# Patient Record
Sex: Male | Born: 1950
Health system: Southern US, Community
[De-identification: ages and names within clinical notes are randomized; demographics above are authoritative.]

## PROBLEM LIST (undated history)

## (undated) DIAGNOSIS — R7303 Prediabetes: Secondary | ICD-10-CM

## (undated) DIAGNOSIS — E079 Disorder of thyroid, unspecified: Secondary | ICD-10-CM

## (undated) DIAGNOSIS — I10 Essential (primary) hypertension: Secondary | ICD-10-CM

## (undated) HISTORY — PX: SHOULDER SURGERY: SHX246

## (undated) HISTORY — PX: MOUTH SURGERY: SHX715

---

## 2000-09-24 ENCOUNTER — Encounter: Payer: Self-pay | Admitting: Family Medicine

## 2000-09-24 ENCOUNTER — Ambulatory Visit (HOSPITAL_COMMUNITY): Admission: RE | Admit: 2000-09-24 | Discharge: 2000-09-24 | Payer: Self-pay | Admitting: Family Medicine

## 2001-09-23 ENCOUNTER — Encounter: Payer: Self-pay | Admitting: Family Medicine

## 2001-09-23 ENCOUNTER — Ambulatory Visit (HOSPITAL_COMMUNITY): Admission: RE | Admit: 2001-09-23 | Discharge: 2001-09-23 | Payer: Self-pay | Admitting: Family Medicine

## 2001-09-25 ENCOUNTER — Encounter: Payer: Self-pay | Admitting: Family Medicine

## 2001-09-25 ENCOUNTER — Ambulatory Visit (HOSPITAL_COMMUNITY): Admission: RE | Admit: 2001-09-25 | Discharge: 2001-09-25 | Payer: Self-pay | Admitting: Family Medicine

## 2008-08-20 ENCOUNTER — Encounter: Admission: RE | Admit: 2008-08-20 | Discharge: 2008-08-20 | Payer: Self-pay | Admitting: Family Medicine

## 2010-06-24 IMAGING — US US EXTREM LOW VENOUS*R*
1 series · 14 of 24 positions shown · non-contrast
Comparison: None.

CLINICAL DATA: Right knee pain.  Evaluate for DVT versus a ruptured
Baker's cyst.



[Series 1: us extrem low venous*right* · 14 of 39 slices shown]
[im 1/39]
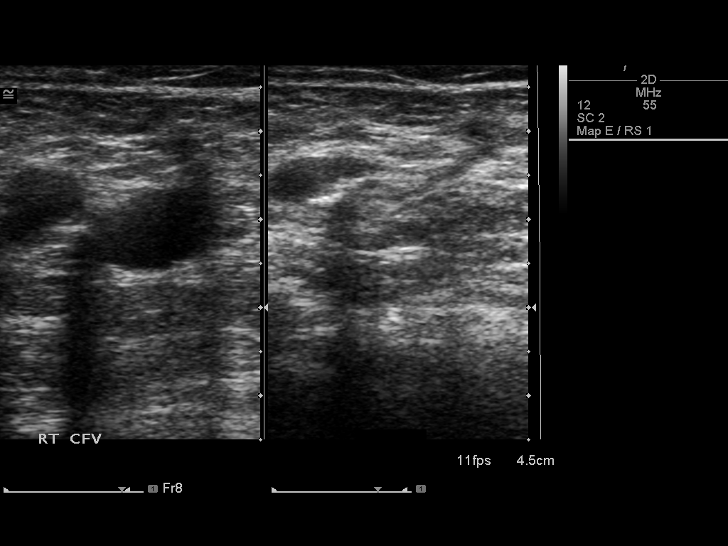
[im 4/39]
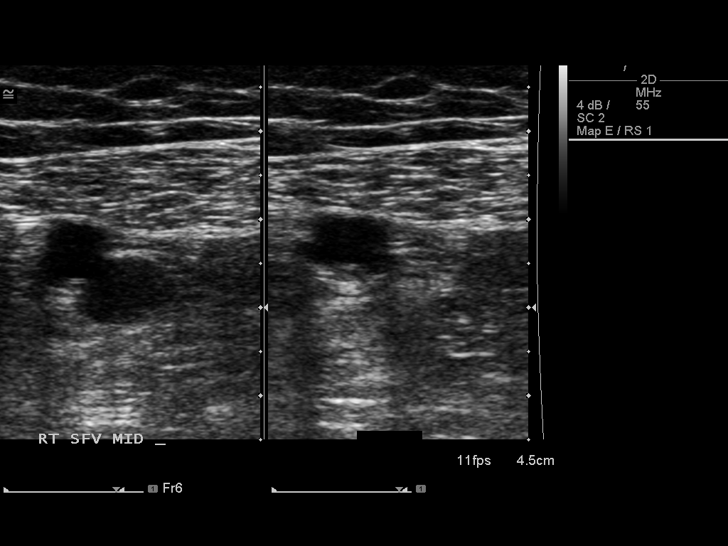
[im 7/39]
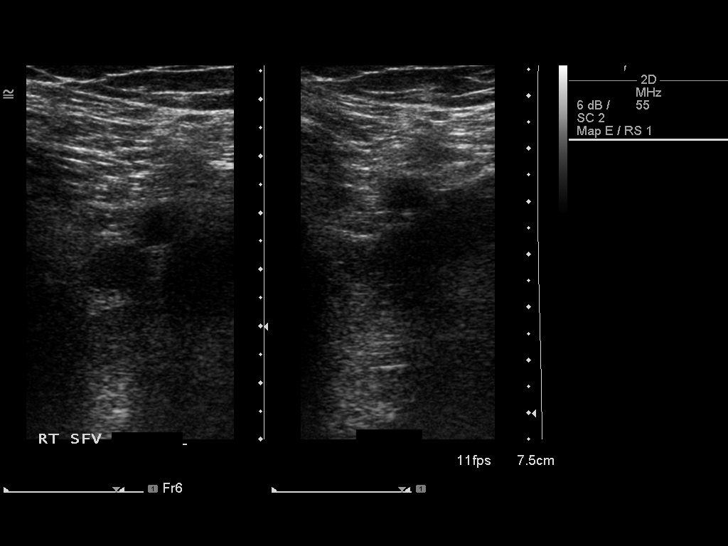
[im 10/39]
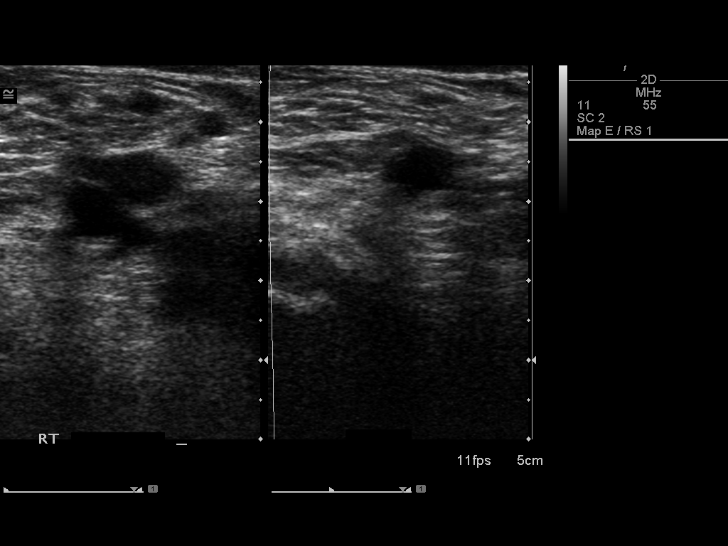
[im 12/39]
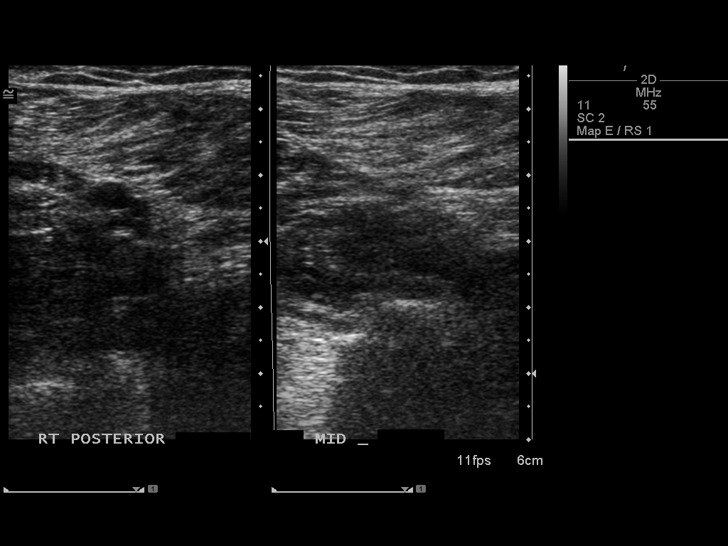
[im 15/39]
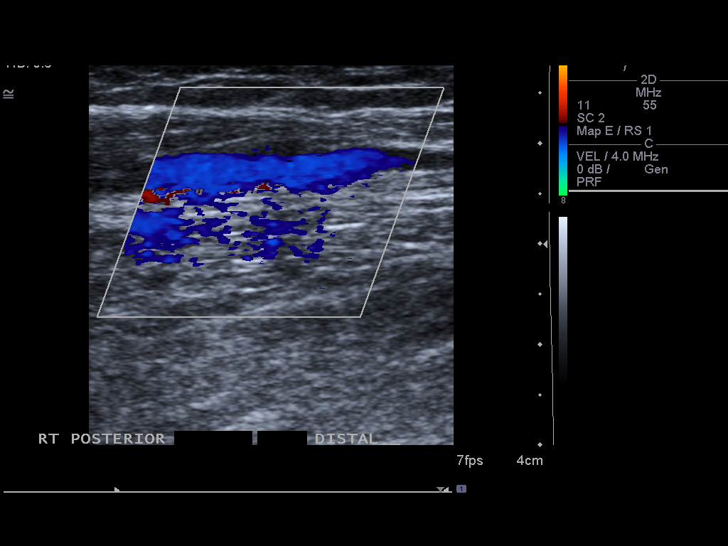
[im 19/39]
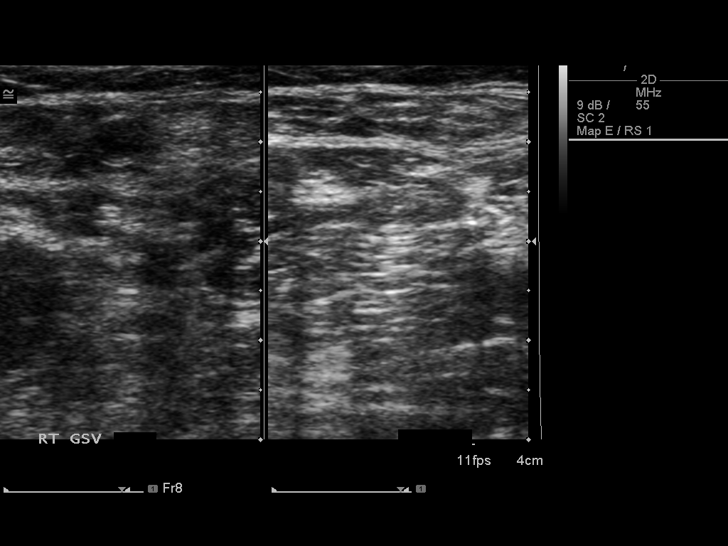
[im 20/39]
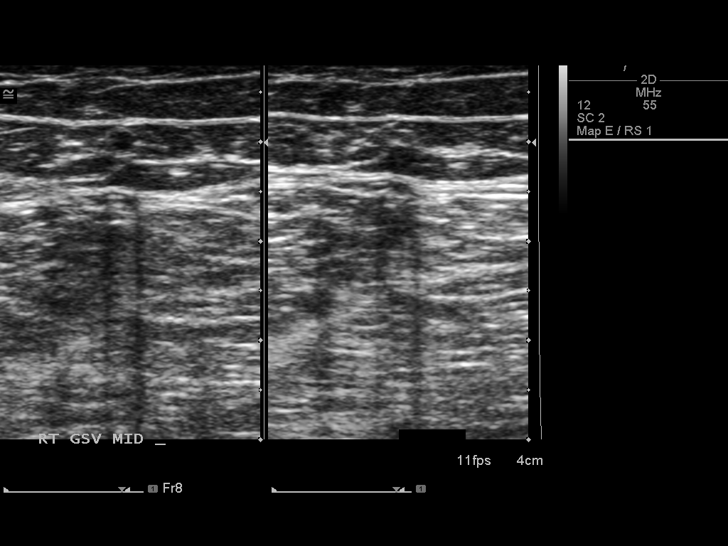
[im 24/39]
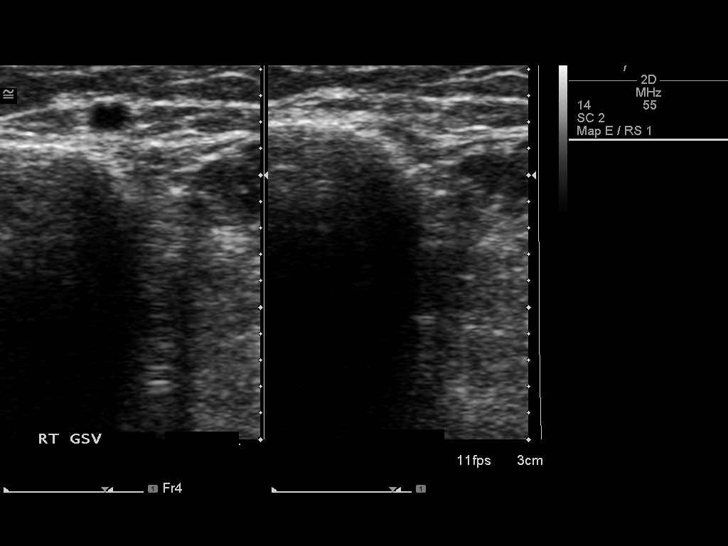
[im 27/39]
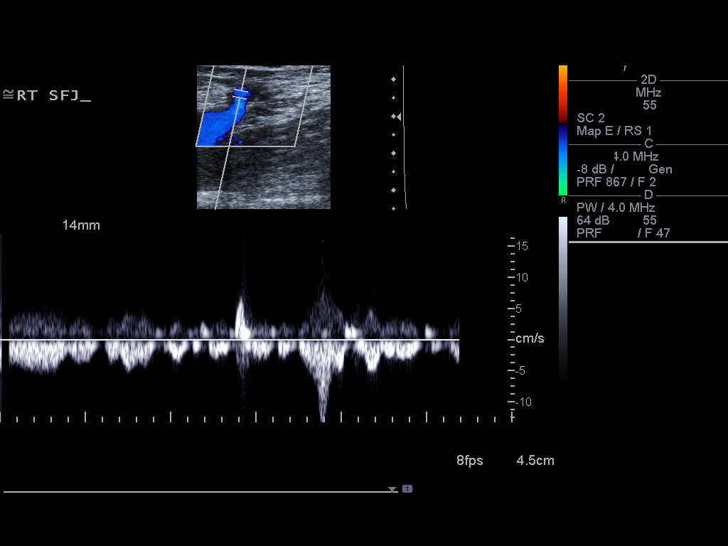
[im 30/39]
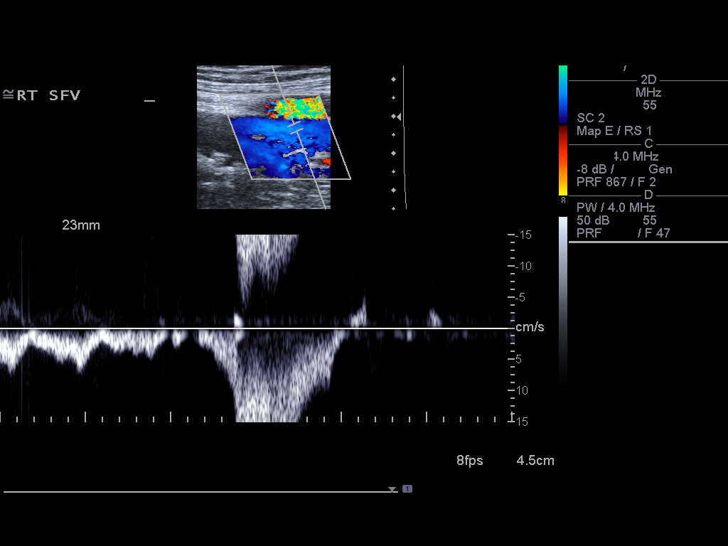
[im 32/39]
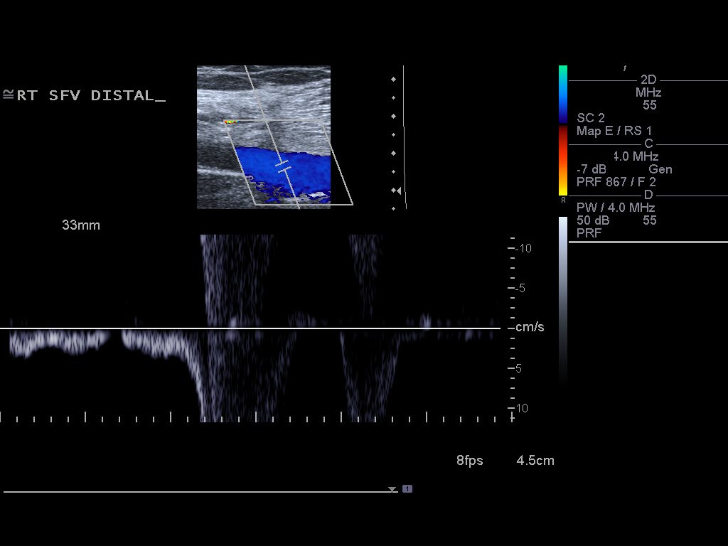
[im 35/39]
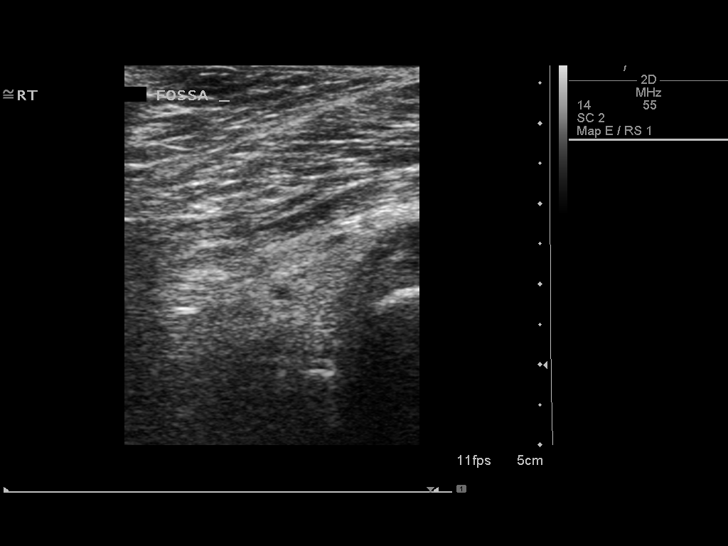
[im 39/39]
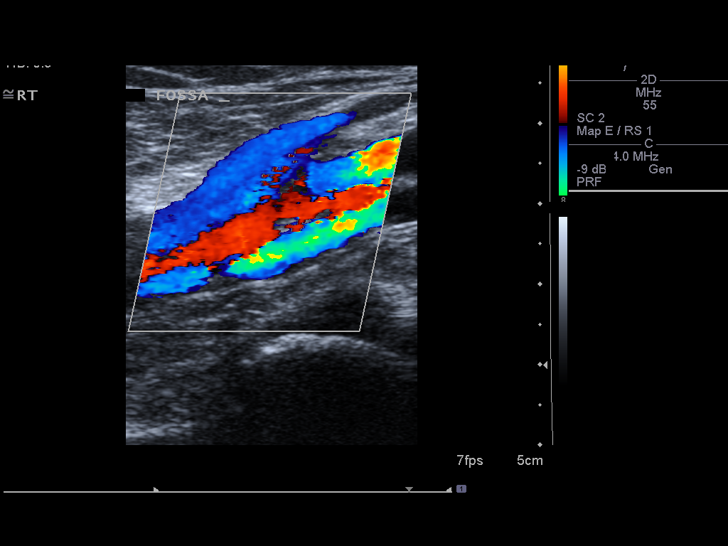

[14 of 24 positions shown; findings below may reference images not displayed]

FINDINGS: From the level of the right common femoral vein to the
right popliteal vein, there is adequate color flow, augmentation
and compression.  There is adequate color flow and compression of
the proximal right calf veins.  Greater saphenous vein compresses
appropriately.  No Baker's cyst or evidence of ruptured Baker's
cyst detected.
IMPRESSION: No evidence of right lower extremity deep venous thrombosis.

No findings of Baker's cyst or ruptured Baker's cyst detected by
ultrasound.

## 2010-09-13 ENCOUNTER — Other Ambulatory Visit: Payer: Self-pay | Admitting: Family Medicine

## 2010-09-13 ENCOUNTER — Ambulatory Visit
Admission: RE | Admit: 2010-09-13 | Discharge: 2010-09-13 | Disposition: A | Discharge status: Home / Self Care | Payer: 59 | Source: Ambulatory Visit | Attending: Family Medicine | Admitting: Family Medicine

## 2010-09-13 DIAGNOSIS — M542 Cervicalgia: Secondary | ICD-10-CM

## 2010-09-13 DIAGNOSIS — M549 Dorsalgia, unspecified: Secondary | ICD-10-CM

## 2012-08-26 ENCOUNTER — Other Ambulatory Visit: Payer: Self-pay | Admitting: Family Medicine

## 2012-08-26 DIAGNOSIS — R1011 Right upper quadrant pain: Secondary | ICD-10-CM

## 2012-08-27 ENCOUNTER — Ambulatory Visit
Admission: RE | Admit: 2012-08-27 | Discharge: 2012-08-27 | Disposition: A | Discharge status: Home / Self Care | Payer: 59 | Source: Ambulatory Visit | Attending: Family Medicine | Admitting: Family Medicine

## 2012-08-27 DIAGNOSIS — R1011 Right upper quadrant pain: Secondary | ICD-10-CM

## 2015-07-06 DIAGNOSIS — Z23 Encounter for immunization: Secondary | ICD-10-CM | POA: Diagnosis not present

## 2015-07-06 DIAGNOSIS — I10 Essential (primary) hypertension: Secondary | ICD-10-CM | POA: Diagnosis not present

## 2015-07-06 DIAGNOSIS — D485 Neoplasm of uncertain behavior of skin: Secondary | ICD-10-CM | POA: Diagnosis not present

## 2015-07-06 DIAGNOSIS — N529 Male erectile dysfunction, unspecified: Secondary | ICD-10-CM | POA: Diagnosis not present

## 2015-07-06 DIAGNOSIS — E7800 Pure hypercholesterolemia, unspecified: Secondary | ICD-10-CM | POA: Diagnosis not present

## 2015-07-06 DIAGNOSIS — Z1211 Encounter for screening for malignant neoplasm of colon: Secondary | ICD-10-CM | POA: Diagnosis not present

## 2015-07-06 DIAGNOSIS — E039 Hypothyroidism, unspecified: Secondary | ICD-10-CM | POA: Diagnosis not present

## 2015-07-06 DIAGNOSIS — Z125 Encounter for screening for malignant neoplasm of prostate: Secondary | ICD-10-CM | POA: Diagnosis not present

## 2015-07-06 DIAGNOSIS — H8110 Benign paroxysmal vertigo, unspecified ear: Secondary | ICD-10-CM | POA: Diagnosis not present

## 2015-07-06 DIAGNOSIS — R7309 Other abnormal glucose: Secondary | ICD-10-CM | POA: Diagnosis not present

## 2015-07-06 DIAGNOSIS — Z0000 Encounter for general adult medical examination without abnormal findings: Secondary | ICD-10-CM | POA: Diagnosis not present

## 2015-07-07 ENCOUNTER — Other Ambulatory Visit: Payer: Self-pay | Admitting: Family Medicine

## 2015-07-07 DIAGNOSIS — Z136 Encounter for screening for cardiovascular disorders: Secondary | ICD-10-CM

## 2015-07-16 ENCOUNTER — Ambulatory Visit
Admission: RE | Admit: 2015-07-16 | Discharge: 2015-07-16 | Disposition: A | Discharge status: Home / Self Care | Payer: Medicare Other | Source: Ambulatory Visit | Attending: Family Medicine | Admitting: Family Medicine

## 2015-07-16 DIAGNOSIS — Z136 Encounter for screening for cardiovascular disorders: Secondary | ICD-10-CM | POA: Diagnosis not present

## 2015-08-23 DIAGNOSIS — D485 Neoplasm of uncertain behavior of skin: Secondary | ICD-10-CM | POA: Diagnosis not present

## 2015-08-23 DIAGNOSIS — L570 Actinic keratosis: Secondary | ICD-10-CM | POA: Diagnosis not present

## 2015-10-28 DIAGNOSIS — Z23 Encounter for immunization: Secondary | ICD-10-CM | POA: Diagnosis not present

## 2015-11-09 ENCOUNTER — Other Ambulatory Visit: Payer: Self-pay | Admitting: Family Medicine

## 2015-11-09 ENCOUNTER — Ambulatory Visit
Admission: RE | Admit: 2015-11-09 | Discharge: 2015-11-09 | Disposition: A | Discharge status: Home / Self Care | Payer: Medicare Other | Source: Ambulatory Visit | Attending: Family Medicine | Admitting: Family Medicine

## 2015-11-09 DIAGNOSIS — R059 Cough, unspecified: Secondary | ICD-10-CM

## 2015-11-09 DIAGNOSIS — R05 Cough: Secondary | ICD-10-CM | POA: Diagnosis not present

## 2015-12-15 DIAGNOSIS — R1031 Right lower quadrant pain: Secondary | ICD-10-CM | POA: Diagnosis not present

## 2015-12-24 DIAGNOSIS — K635 Polyp of colon: Secondary | ICD-10-CM | POA: Diagnosis not present

## 2015-12-24 DIAGNOSIS — Z1211 Encounter for screening for malignant neoplasm of colon: Secondary | ICD-10-CM | POA: Diagnosis not present

## 2015-12-24 DIAGNOSIS — K633 Ulcer of intestine: Secondary | ICD-10-CM | POA: Diagnosis not present

## 2015-12-24 DIAGNOSIS — K523 Indeterminate colitis: Secondary | ICD-10-CM | POA: Diagnosis not present

## 2015-12-24 DIAGNOSIS — K529 Noninfective gastroenteritis and colitis, unspecified: Secondary | ICD-10-CM | POA: Diagnosis not present

## 2015-12-24 DIAGNOSIS — D126 Benign neoplasm of colon, unspecified: Secondary | ICD-10-CM | POA: Diagnosis not present

## 2015-12-28 DIAGNOSIS — D126 Benign neoplasm of colon, unspecified: Secondary | ICD-10-CM | POA: Diagnosis not present

## 2015-12-28 DIAGNOSIS — K633 Ulcer of intestine: Secondary | ICD-10-CM | POA: Diagnosis not present

## 2015-12-28 DIAGNOSIS — Z1211 Encounter for screening for malignant neoplasm of colon: Secondary | ICD-10-CM | POA: Diagnosis not present

## 2015-12-28 DIAGNOSIS — K635 Polyp of colon: Secondary | ICD-10-CM | POA: Diagnosis not present

## 2016-01-10 DIAGNOSIS — H8110 Benign paroxysmal vertigo, unspecified ear: Secondary | ICD-10-CM | POA: Diagnosis not present

## 2016-01-10 DIAGNOSIS — I10 Essential (primary) hypertension: Secondary | ICD-10-CM | POA: Diagnosis not present

## 2016-01-10 DIAGNOSIS — E7800 Pure hypercholesterolemia, unspecified: Secondary | ICD-10-CM | POA: Diagnosis not present

## 2016-01-10 DIAGNOSIS — R7301 Impaired fasting glucose: Secondary | ICD-10-CM | POA: Diagnosis not present

## 2016-01-10 DIAGNOSIS — N529 Male erectile dysfunction, unspecified: Secondary | ICD-10-CM | POA: Diagnosis not present

## 2016-07-05 DIAGNOSIS — E119 Type 2 diabetes mellitus without complications: Secondary | ICD-10-CM | POA: Diagnosis not present

## 2016-07-05 DIAGNOSIS — H25813 Combined forms of age-related cataract, bilateral: Secondary | ICD-10-CM | POA: Diagnosis not present

## 2016-08-08 DIAGNOSIS — Z23 Encounter for immunization: Secondary | ICD-10-CM | POA: Diagnosis not present

## 2016-08-08 DIAGNOSIS — R7303 Prediabetes: Secondary | ICD-10-CM | POA: Diagnosis not present

## 2016-08-08 DIAGNOSIS — F17200 Nicotine dependence, unspecified, uncomplicated: Secondary | ICD-10-CM | POA: Diagnosis not present

## 2016-08-08 DIAGNOSIS — Z1389 Encounter for screening for other disorder: Secondary | ICD-10-CM | POA: Diagnosis not present

## 2016-08-08 DIAGNOSIS — N529 Male erectile dysfunction, unspecified: Secondary | ICD-10-CM | POA: Diagnosis not present

## 2016-08-08 DIAGNOSIS — H8110 Benign paroxysmal vertigo, unspecified ear: Secondary | ICD-10-CM | POA: Diagnosis not present

## 2016-08-08 DIAGNOSIS — E7800 Pure hypercholesterolemia, unspecified: Secondary | ICD-10-CM | POA: Diagnosis not present

## 2016-08-08 DIAGNOSIS — Z6827 Body mass index (BMI) 27.0-27.9, adult: Secondary | ICD-10-CM | POA: Diagnosis not present

## 2016-08-08 DIAGNOSIS — I77811 Abdominal aortic ectasia: Secondary | ICD-10-CM | POA: Diagnosis not present

## 2016-08-08 DIAGNOSIS — Z0000 Encounter for general adult medical examination without abnormal findings: Secondary | ICD-10-CM | POA: Diagnosis not present

## 2016-08-08 DIAGNOSIS — I10 Essential (primary) hypertension: Secondary | ICD-10-CM | POA: Diagnosis not present

## 2016-09-20 DIAGNOSIS — M62830 Muscle spasm of back: Secondary | ICD-10-CM | POA: Diagnosis not present

## 2016-10-02 DIAGNOSIS — I10 Essential (primary) hypertension: Secondary | ICD-10-CM | POA: Diagnosis not present

## 2016-10-02 DIAGNOSIS — R109 Unspecified abdominal pain: Secondary | ICD-10-CM | POA: Diagnosis not present

## 2016-10-20 DIAGNOSIS — Z23 Encounter for immunization: Secondary | ICD-10-CM | POA: Diagnosis not present

## 2017-08-03 DIAGNOSIS — R55 Syncope and collapse: Secondary | ICD-10-CM | POA: Diagnosis not present

## 2017-08-03 DIAGNOSIS — K529 Noninfective gastroenteritis and colitis, unspecified: Secondary | ICD-10-CM | POA: Diagnosis not present

## 2017-08-15 DIAGNOSIS — E039 Hypothyroidism, unspecified: Secondary | ICD-10-CM | POA: Diagnosis not present

## 2017-08-15 DIAGNOSIS — R7303 Prediabetes: Secondary | ICD-10-CM | POA: Diagnosis not present

## 2017-08-15 DIAGNOSIS — Z1389 Encounter for screening for other disorder: Secondary | ICD-10-CM | POA: Diagnosis not present

## 2017-08-15 DIAGNOSIS — H8110 Benign paroxysmal vertigo, unspecified ear: Secondary | ICD-10-CM | POA: Diagnosis not present

## 2017-08-15 DIAGNOSIS — E7800 Pure hypercholesterolemia, unspecified: Secondary | ICD-10-CM | POA: Diagnosis not present

## 2017-08-15 DIAGNOSIS — Z125 Encounter for screening for malignant neoplasm of prostate: Secondary | ICD-10-CM | POA: Diagnosis not present

## 2017-08-15 DIAGNOSIS — Z23 Encounter for immunization: Secondary | ICD-10-CM | POA: Diagnosis not present

## 2017-08-15 DIAGNOSIS — I10 Essential (primary) hypertension: Secondary | ICD-10-CM | POA: Diagnosis not present

## 2017-08-15 DIAGNOSIS — Z0000 Encounter for general adult medical examination without abnormal findings: Secondary | ICD-10-CM | POA: Diagnosis not present

## 2017-08-15 DIAGNOSIS — F17200 Nicotine dependence, unspecified, uncomplicated: Secondary | ICD-10-CM | POA: Diagnosis not present

## 2017-08-15 DIAGNOSIS — N529 Male erectile dysfunction, unspecified: Secondary | ICD-10-CM | POA: Diagnosis not present

## 2017-08-15 DIAGNOSIS — Z136 Encounter for screening for cardiovascular disorders: Secondary | ICD-10-CM | POA: Diagnosis not present

## 2017-08-25 DIAGNOSIS — W19XXXA Unspecified fall, initial encounter: Secondary | ICD-10-CM | POA: Diagnosis not present

## 2017-08-25 DIAGNOSIS — S4352XA Sprain of left acromioclavicular joint, initial encounter: Secondary | ICD-10-CM | POA: Diagnosis not present

## 2017-09-12 IMAGING — CR DG CHEST 2V
2 series · 2 of 2 positions shown · non-contrast
Comparison: 09/13/2010

CLINICAL DATA: Cough with fever for 5-6 days

EXAM:
CHEST  2 VIEW

[w chest pa]
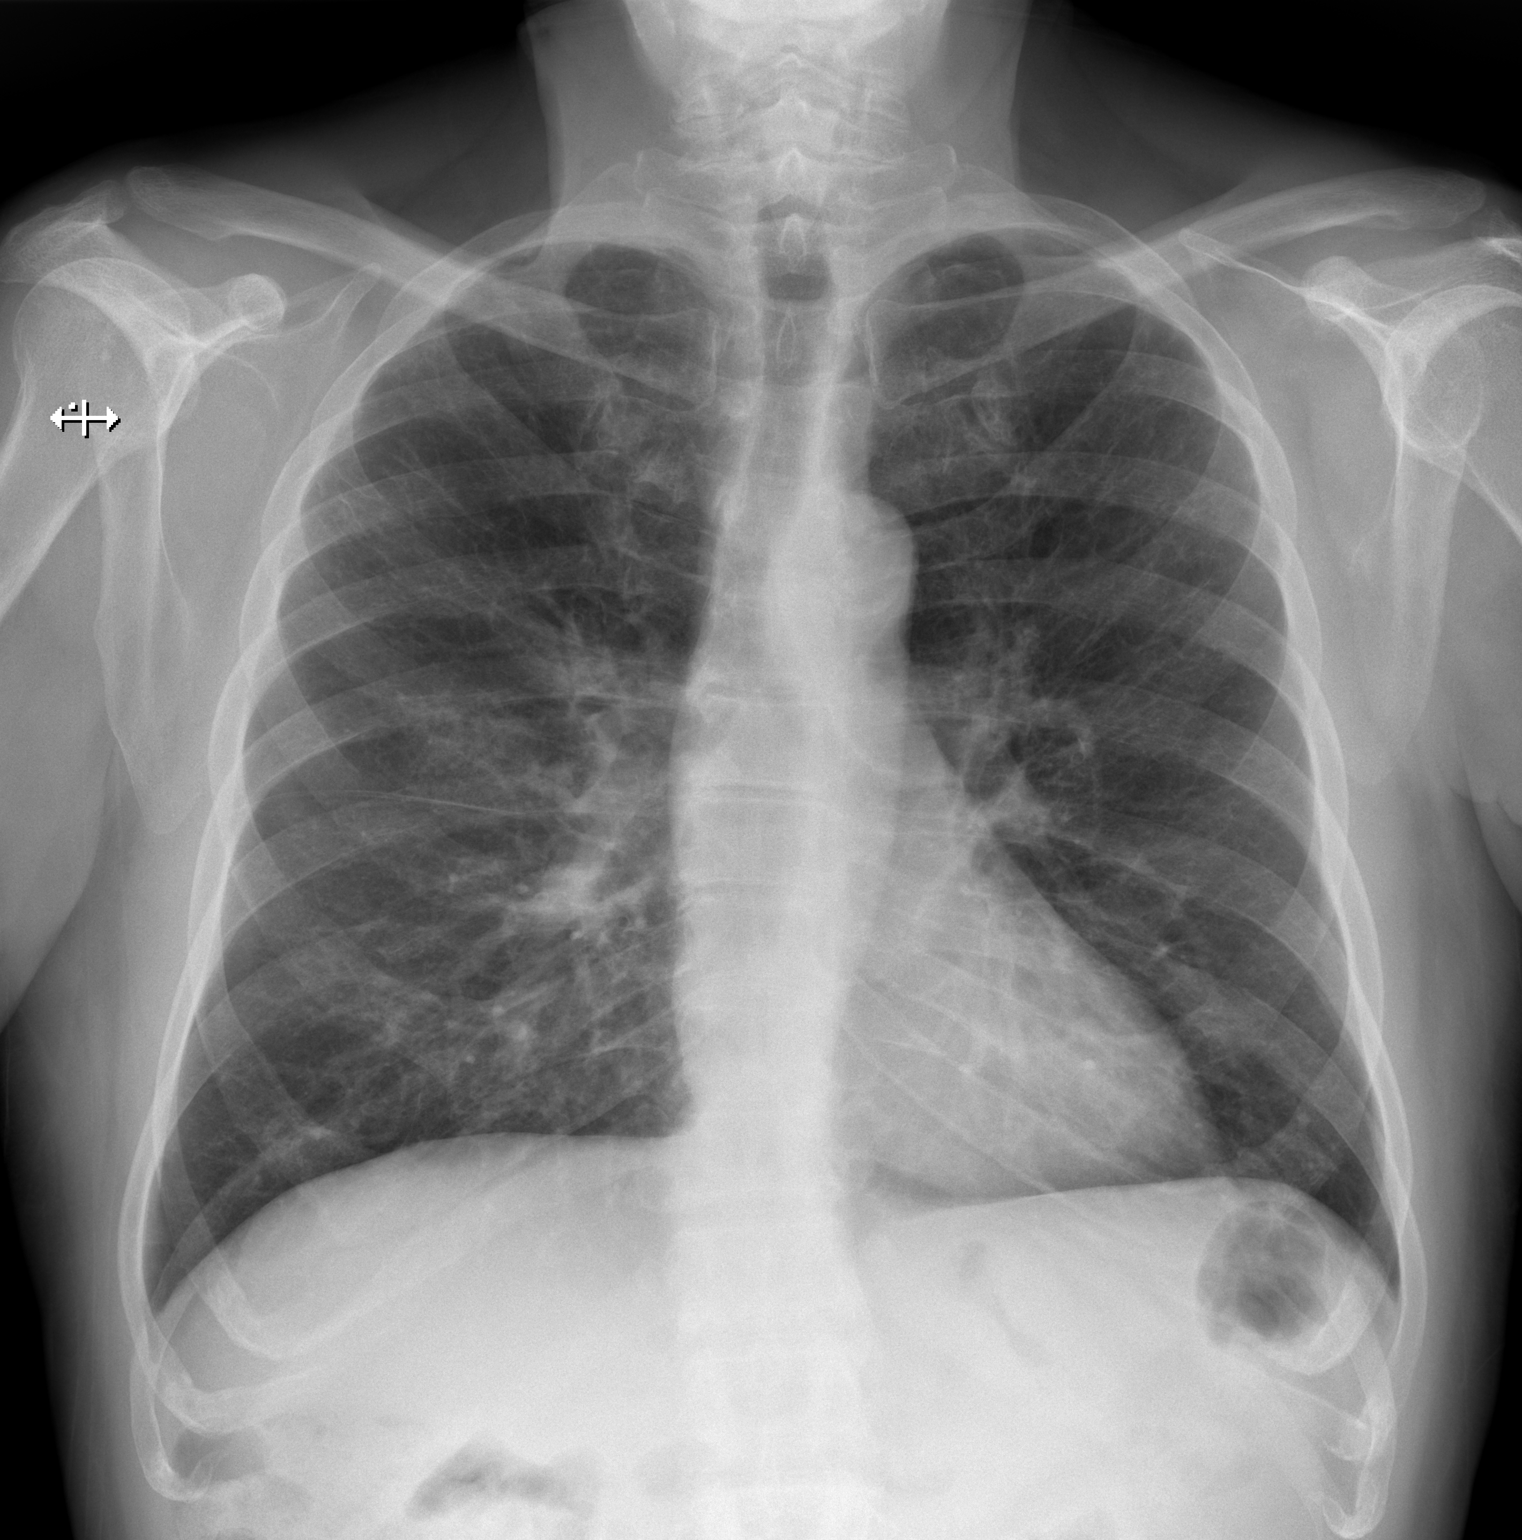

[w chest lat]
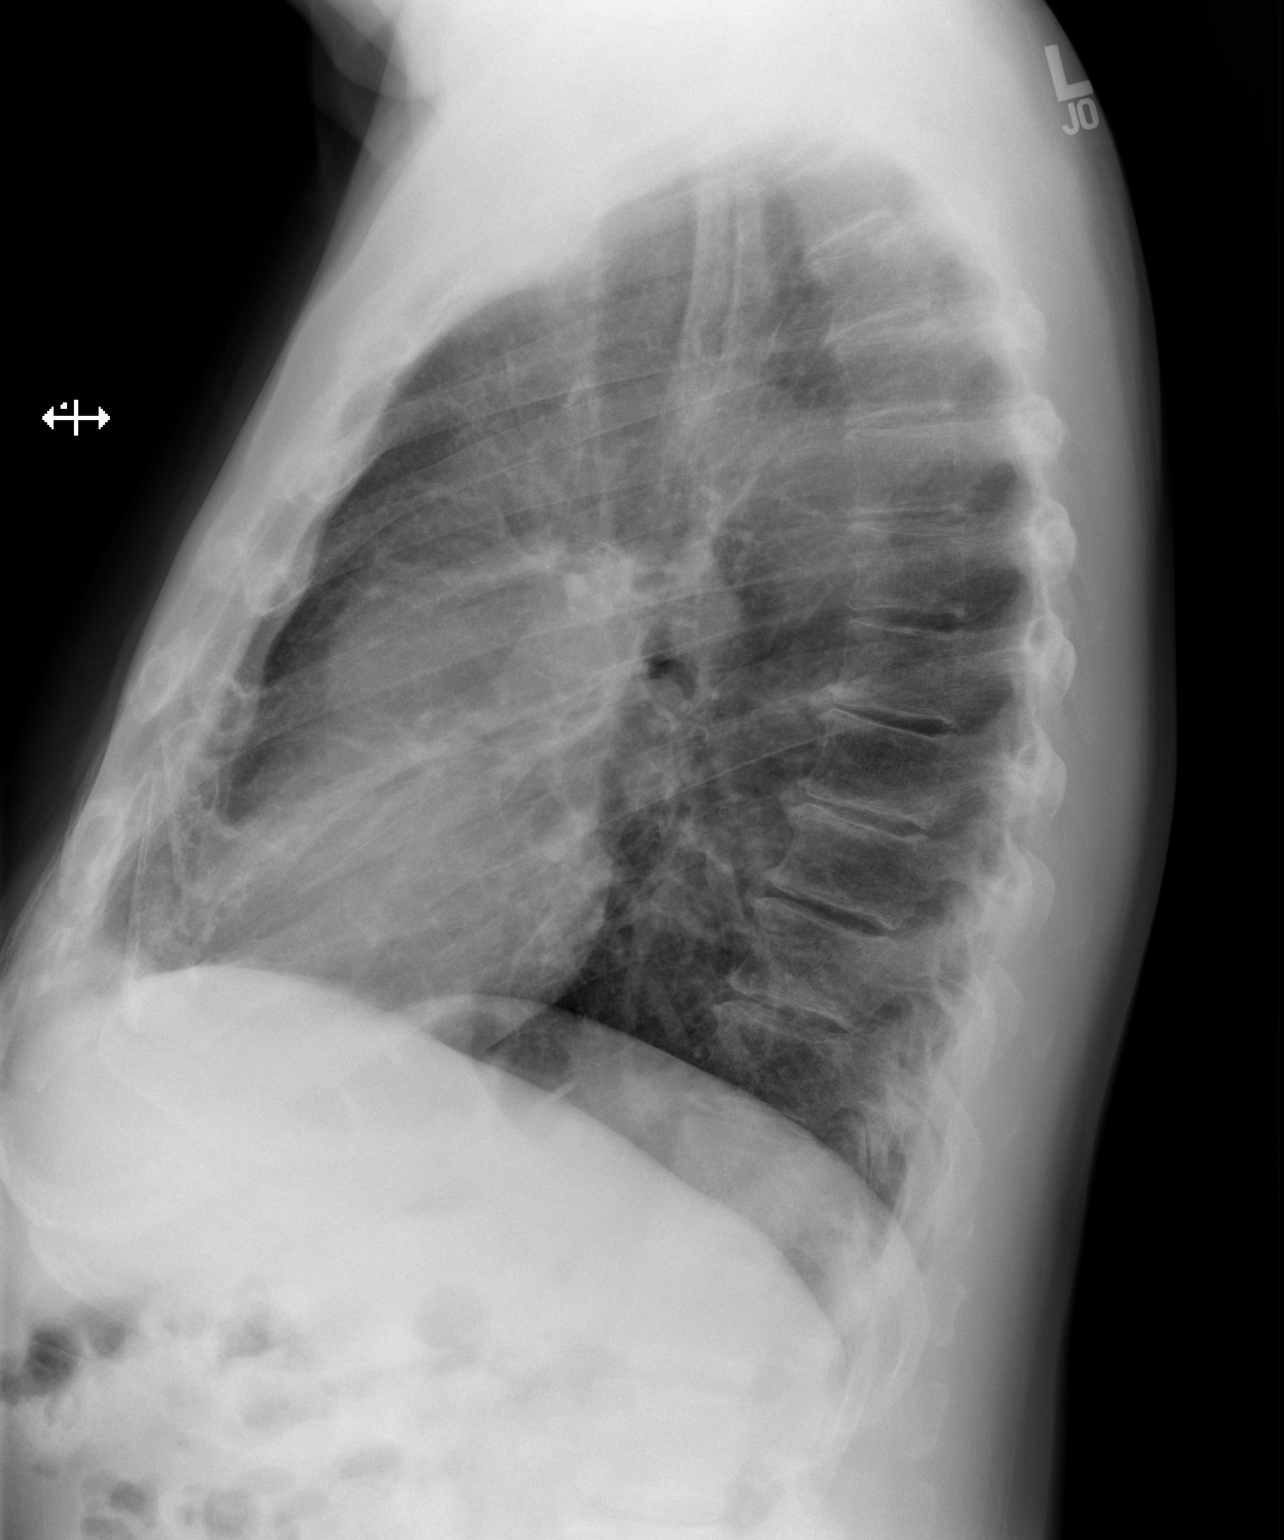

[2 of 2 positions shown; findings below may reference images not displayed]

FINDINGS: The heart size and mediastinal contours are within normal limits.
Both lungs are clear. The visualized skeletal structures are
unremarkable.
IMPRESSION: No active cardiopulmonary disease.

## 2017-09-18 DIAGNOSIS — M7582 Other shoulder lesions, left shoulder: Secondary | ICD-10-CM | POA: Diagnosis not present

## 2017-12-07 ENCOUNTER — Ambulatory Visit (HOSPITAL_COMMUNITY)
Admission: EM | Admit: 2017-12-07 | Discharge: 2017-12-07 | Disposition: A | Discharge status: Home / Self Care | Payer: Medicare Other | Attending: Family Medicine | Admitting: Family Medicine

## 2017-12-07 ENCOUNTER — Encounter (HOSPITAL_COMMUNITY): Payer: Self-pay

## 2017-12-07 DIAGNOSIS — R1031 Right lower quadrant pain: Secondary | ICD-10-CM | POA: Diagnosis not present

## 2017-12-07 HISTORY — DX: Essential (primary) hypertension: I10

## 2017-12-07 HISTORY — DX: Disorder of thyroid, unspecified: E07.9

## 2017-12-07 MED ORDER — CYCLOBENZAPRINE HCL 5 MG PO TABS: 5.0000 mg | ORAL_TABLET | Freq: Three times a day (TID) | ORAL | 0 refills | Status: DC | PRN

## 2017-12-07 MED ORDER — IBUPROFEN 800 MG PO TABS: 800.0000 mg | ORAL_TABLET | Freq: Three times a day (TID) | ORAL | 0 refills | Status: DC

## 2017-12-07 NOTE — ED Provider Notes (Signed)
[facility]    CSN: [id number] Arrival date & time: 12/07/17  0830     History   Chief Complaint Chief Complaint  Patient presents with  . Abdominal Pain    Lower Right Quadrant    HPI Alfred Gomez is a 67 y.o. male.   HPI  Patient is here with right lower quadrant abdominal pain.  Is worse with twisting and movement.  No accident or injury.  No fall.  No heavy lifting.  He has a decreased appetite today.  He does have one episode of vomiting last night.  His wife states he was doubled over in pain.  His temperature at home was 99.  Normal bowel movement yesterday.  He has known diverticular disease.  This usually causes left lower quadrant pain.  He states he is been having recurring abdominal pain off and on for 5 years.  He has been told he has gastritis, diverticulitis, and multitude of conditions.  No abdominal surgeries.  He has had 2 colonoscopies.  Benign polyps.  Past Medical History:  Diagnosis Date  . Hypertension   . Thyroid condition     There are no active problems to display for this patient. Medical problems include hypothyroidism, hyperlipidemia and hypertension all well controlled Primary care through [facility] Past Surgical History:  Procedure Laterality Date  . MOUTH SURGERY    . SHOULDER SURGERY         Home Medications    Prior to Admission medications   Medication Sig Start Date End Date Taking? Authorizing Provider  ezetimibe (ZETIA) 10 MG tablet Take 10 mg by mouth daily.   Yes [provider]  irbesartan-hydrochlorothiazide (AVALIDE) 150-12.5 MG tablet Take 1 tablet by mouth daily.   Yes [provider]  levothyroxine (SYNTHROID, LEVOTHROID) 150 MCG tablet Take 150 mcg by mouth daily before breakfast.   Yes [provider]  rosuvastatin (CRESTOR) 20 MG tablet Take 20 mg by mouth daily.   Yes [provider]  cyclobenzaprine (FLEXERIL) 5 MG tablet Take 1 tablet (5 mg total) by mouth 3  (three) times daily as needed for muscle spasms. 12/07/17   Raylene Everts, MD  ibuprofen (ADVIL,MOTRIN) 800 MG tablet Take 1 tablet (800 mg total) by mouth 3 (three) times daily. 12/07/17   Raylene Everts, MD    Family History History reviewed. No pertinent family history.  Social History Social History   Tobacco Use  . Smoking status: Not on file  Substance Use Topics  . Alcohol use: Not on file  . Drug use: Not on file     Allergies   Patient has no known allergies.   Review of Systems Review of Systems  Constitutional: Negative for chills and fever.  HENT: Negative for ear pain and sore throat.   Eyes: Negative for pain and visual disturbance.  Respiratory: Negative for cough and shortness of breath.   Cardiovascular: Negative for chest pain and palpitations.  Gastrointestinal: Positive for abdominal pain. Negative for vomiting.       Belching  Genitourinary: Negative for dysuria and hematuria.  Musculoskeletal: Negative for arthralgias and back pain.  Skin: Negative for color change and rash.  Neurological: Negative for seizures and syncope.  Psychiatric/Behavioral: Negative for dysphoric mood. The patient is not nervous/anxious.   All other systems reviewed and are negative.    Physical Exam Triage Vital Signs ED Triage Vitals  Enc Vitals Group     BP 12/07/17 0845 119/65  Pulse Rate 12/07/17 0845 99     Resp 12/07/17 0845 16     Temp 12/07/17 0845 98.4 F (36.9 C)     Temp Source 12/07/17 0845 Oral     SpO2 12/07/17 0845 96 %     Weight --      Height --      Head Circumference --      Peak Flow --      Pain Score 12/07/17 0849 5     Pain Loc --      Pain Edu? --      Excl. in [place]? --    No data found.  Updated Vital Signs BP 119/65 (BP Location: Left Arm)   Pulse 99   Temp 98.4 F (36.9 C) (Oral)   Resp 16   SpO2 96%      Physical Exam  Constitutional: He appears well-developed and well-nourished. No distress.  HENT:  Head:  Normocephalic and atraumatic.  Mouth/Throat: Oropharynx is clear and moist.  Eyes: Pupils are equal, round, and reactive to light. Conjunctivae are normal.  Neck: Normal range of motion.  Cardiovascular: Normal rate, regular rhythm and normal heart sounds.  Pulmonary/Chest: Effort normal and breath sounds normal. No respiratory distress.  Abdominal: Soft. Bowel sounds are normal. He exhibits no distension and no mass. There is no hepatosplenomegaly. There is tenderness. There is no rigidity, no rebound and no CVA tenderness. No hernia.    Musculoskeletal: Normal range of motion. He exhibits no edema.  Neurological: He is alert.  Skin: Skin is warm and dry.  Psychiatric: He has a normal mood and affect. His behavior is normal.     UC Treatments / Results  Labs (all labs ordered are listed, but only abnormal results are displayed) Labs Reviewed - No data to display  EKG None  Radiology No results found.  Procedures Procedures (including critical care time)  Medications Ordered in UC Medications - No data to display  Initial Impression / Assessment and Plan / UC Course  I have reviewed the triage vital signs and the nursing notes.  Pertinent labs & imaging results that were available during my care of the patient were reviewed by me and considered in my medical decision making (see chart for details).     I discussed with patient that this is muscular by examination.  Producible with straight leg raise and palpation of abdominal muscles.  Abdominal exam entirely normal.  No tenderness guarding or rebound. Final Clinical Impressions(s) / UC Diagnoses   Final diagnoses:  Abdominal wall pain in right lower quadrant     Discharge Instructions     Rest Ice or heat to area Take the ibuprofen 3 x a day with food Take the cyclobenzaprine as needed muscle relaxer Return if worse, or if you develop fever, nausea, vomiting   ED Prescriptions    Medication Sig Dispense  Auth. Provider   ibuprofen (ADVIL,MOTRIN) 800 MG tablet Take 1 tablet (800 mg total) by mouth 3 (three) times daily. 21 tablet Raylene Everts, MD   cyclobenzaprine (FLEXERIL) 5 MG tablet Take 1 tablet (5 mg total) by mouth 3 (three) times daily as needed for muscle spasms. 30 tablet Raylene Everts, MD     Controlled Substance Prescriptions [place] Controlled Substance Registry consulted? Not Applicable   Raylene Everts, MD 12/07/17 1025

## 2017-12-07 NOTE — Discharge Instructions (Signed)
Rest Ice or heat to area Take the ibuprofen 3 x a day with food Take the cyclobenzaprine as needed muscle relaxer Return if worse, or if you develop fever, nausea, vomiting

## 2017-12-07 NOTE — ED Triage Notes (Signed)
Pt presents with abdominal pain in the lower right quadrant that increase with certain movements and when he walks or bear down weight on his feet.  Pt states he also vomited and has not been able to eat much.

## 2018-01-31 DIAGNOSIS — F17200 Nicotine dependence, unspecified, uncomplicated: Secondary | ICD-10-CM | POA: Diagnosis not present

## 2018-01-31 DIAGNOSIS — J441 Chronic obstructive pulmonary disease with (acute) exacerbation: Secondary | ICD-10-CM | POA: Diagnosis not present

## 2018-03-24 ENCOUNTER — Ambulatory Visit: Payer: Self-pay | Admitting: Surgery

## 2018-03-24 ENCOUNTER — Emergency Department (HOSPITAL_COMMUNITY): Payer: Medicare Other

## 2018-03-24 ENCOUNTER — Encounter (HOSPITAL_COMMUNITY): Payer: Self-pay | Admitting: Emergency Medicine

## 2018-03-24 ENCOUNTER — Emergency Department (HOSPITAL_COMMUNITY): Payer: Medicare Other | Admitting: Certified Registered Nurse Anesthetist

## 2018-03-24 ENCOUNTER — Encounter (HOSPITAL_COMMUNITY)
Admission: EM | Disposition: A | Discharge status: Home / Self Care | Payer: Self-pay | Source: Home / Self Care | Attending: Emergency Medicine

## 2018-03-24 ENCOUNTER — Observation Stay (HOSPITAL_COMMUNITY)
Admission: EM | Admit: 2018-03-24 | Discharge: 2018-03-26 | Disposition: A | Discharge status: Home / Self Care | Payer: Medicare Other | Attending: General Surgery | Admitting: General Surgery

## 2018-03-24 DIAGNOSIS — E785 Hyperlipidemia, unspecified: Secondary | ICD-10-CM | POA: Insufficient documentation

## 2018-03-24 DIAGNOSIS — E039 Hypothyroidism, unspecified: Secondary | ICD-10-CM | POA: Insufficient documentation

## 2018-03-24 DIAGNOSIS — K3532 Acute appendicitis with perforation and localized peritonitis, without abscess: Secondary | ICD-10-CM | POA: Diagnosis not present

## 2018-03-24 DIAGNOSIS — Z9049 Acquired absence of other specified parts of digestive tract: Secondary | ICD-10-CM

## 2018-03-24 DIAGNOSIS — I10 Essential (primary) hypertension: Secondary | ICD-10-CM | POA: Diagnosis not present

## 2018-03-24 DIAGNOSIS — K3533 Acute appendicitis with perforation and localized peritonitis, with abscess: Principal | ICD-10-CM | POA: Insufficient documentation

## 2018-03-24 DIAGNOSIS — F17210 Nicotine dependence, cigarettes, uncomplicated: Secondary | ICD-10-CM | POA: Insufficient documentation

## 2018-03-24 DIAGNOSIS — I447 Left bundle-branch block, unspecified: Secondary | ICD-10-CM | POA: Diagnosis not present

## 2018-03-24 DIAGNOSIS — K352 Acute appendicitis with generalized peritonitis, without abscess: Secondary | ICD-10-CM

## 2018-03-24 DIAGNOSIS — Z79890 Hormone replacement therapy (postmenopausal): Secondary | ICD-10-CM | POA: Diagnosis not present

## 2018-03-24 DIAGNOSIS — R1031 Right lower quadrant pain: Secondary | ICD-10-CM | POA: Diagnosis not present

## 2018-03-24 DIAGNOSIS — Z79899 Other long term (current) drug therapy: Secondary | ICD-10-CM | POA: Diagnosis not present

## 2018-03-24 DIAGNOSIS — R110 Nausea: Secondary | ICD-10-CM | POA: Diagnosis not present

## 2018-03-24 DIAGNOSIS — R1011 Right upper quadrant pain: Secondary | ICD-10-CM | POA: Diagnosis not present

## 2018-03-24 DIAGNOSIS — K3580 Unspecified acute appendicitis: Secondary | ICD-10-CM | POA: Diagnosis not present

## 2018-03-24 DIAGNOSIS — K37 Unspecified appendicitis: Secondary | ICD-10-CM

## 2018-03-24 HISTORY — DX: Prediabetes: R73.03

## 2018-03-24 HISTORY — PX: LAPAROSCOPIC APPENDECTOMY: SHX408

## 2018-03-24 HISTORY — PX: APPENDECTOMY: SHX54

## 2018-03-24 LAB — CBC
HCT: 46.2 % (ref 39.0–52.0)
Hemoglobin: 15.4 g/dL (ref 13.0–17.0)
MCH: 30.4 pg (ref 26.0–34.0)
MCHC: 33.3 g/dL (ref 30.0–36.0)
MCV: 91.3 fL (ref 80.0–100.0)
PLATELETS: 221 10*3/uL (ref 150–400)
RBC: 5.06 MIL/uL (ref 4.22–5.81)
RDW: 13.4 % (ref 11.5–15.5)
WBC: 7.3 10*3/uL (ref 4.0–10.5)
nRBC: 0 % (ref 0.0–0.2)

## 2018-03-24 LAB — COMPREHENSIVE METABOLIC PANEL
ALT: 29 U/L (ref 0–44)
ANION GAP: 12 (ref 5–15)
AST: 25 U/L (ref 15–41)
Albumin: 4 g/dL (ref 3.5–5.0)
Alkaline Phosphatase: 52 U/L (ref 38–126)
BUN: 14 mg/dL (ref 8–23)
CALCIUM: 9.3 mg/dL (ref 8.9–10.3)
CO2: 24 mmol/L (ref 22–32)
CREATININE: 0.94 mg/dL (ref 0.61–1.24)
Chloride: 102 mmol/L (ref 98–111)
GLUCOSE: 144 mg/dL — AB (ref 70–99)
Potassium: 3.8 mmol/L (ref 3.5–5.1)
SODIUM: 138 mmol/L (ref 135–145)
TOTAL PROTEIN: 7.5 g/dL (ref 6.5–8.1)
Total Bilirubin: 0.9 mg/dL (ref 0.3–1.2)

## 2018-03-24 LAB — URINALYSIS, ROUTINE W REFLEX MICROSCOPIC
BILIRUBIN URINE: NEGATIVE
GLUCOSE, UA: NEGATIVE mg/dL
Hgb urine dipstick: NEGATIVE
KETONES UR: NEGATIVE mg/dL
Leukocytes, UA: NEGATIVE
Nitrite: NEGATIVE
PROTEIN: NEGATIVE mg/dL
Specific Gravity, Urine: >1.046 — ABNORMAL HIGH (ref 1.005–1.030)
pH: 6 (ref 5.0–8.0)

## 2018-03-24 LAB — LIPASE, BLOOD: Lipase: 27 U/L (ref 11–51)

## 2018-03-24 SURGERY — APPENDECTOMY, LAPAROSCOPIC
Anesthesia: General | Site: Abdomen

## 2018-03-24 MED ORDER — LIDOCAINE 2% (20 MG/ML) 5 ML SYRINGE
INTRAMUSCULAR | Status: AC
  Filled 2018-03-24: qty 5

## 2018-03-24 MED ORDER — METHOCARBAMOL 500 MG PO TABS: 500.0000 mg | ORAL_TABLET | Freq: Four times a day (QID) | ORAL | Status: DC | PRN

## 2018-03-24 MED ORDER — GABAPENTIN 300 MG PO CAPS
300.0000 mg | ORAL_CAPSULE | Freq: Two times a day (BID) | ORAL | Status: DC
  Administered 2018-03-24 – 2018-03-25 (×4): 300 mg via ORAL
  Filled 2018-03-24 (×4): qty 1

## 2018-03-24 MED ORDER — OXYCODONE HCL 5 MG PO TABS: 5.0000 mg | ORAL_TABLET | ORAL | Status: DC | PRN

## 2018-03-24 MED ORDER — ONDANSETRON HCL 4 MG/2ML IJ SOLN: 4.0000 mg | Freq: Four times a day (QID) | INTRAMUSCULAR | Status: DC | PRN

## 2018-03-24 MED ORDER — KETOROLAC TROMETHAMINE 30 MG/ML IJ SOLN: INTRAMUSCULAR | Status: DC | PRN

## 2018-03-24 MED ORDER — ROCURONIUM BROMIDE 100 MG/10ML IV SOLN
INTRAVENOUS | Status: DC | PRN
  Administered 2018-03-24: 50 mg via INTRAVENOUS

## 2018-03-24 MED ORDER — METRONIDAZOLE IN NACL 5-0.79 MG/ML-% IV SOLN
500.0000 mg | Freq: Once | INTRAVENOUS | Status: AC
  Administered 2018-03-24: 500 mg via INTRAVENOUS
  Filled 2018-03-24: qty 100

## 2018-03-24 MED ORDER — ROCURONIUM BROMIDE 50 MG/5ML IV SOSY
PREFILLED_SYRINGE | INTRAVENOUS | Status: AC
  Filled 2018-03-24: qty 5

## 2018-03-24 MED ORDER — ENOXAPARIN SODIUM 40 MG/0.4ML ~~LOC~~ SOLN
40.0000 mg | SUBCUTANEOUS | Status: DC
  Administered 2018-03-25: 40 mg via SUBCUTANEOUS
  Filled 2018-03-24: qty 0.4

## 2018-03-24 MED ORDER — ONDANSETRON 4 MG PO TBDP: 4.0000 mg | ORAL_TABLET | Freq: Four times a day (QID) | ORAL | Status: DC | PRN

## 2018-03-24 MED ORDER — IOHEXOL 300 MG/ML  SOLN
100.0000 mL | Freq: Once | INTRAMUSCULAR | Status: AC | PRN
  Administered 2018-03-24: 100 mL via INTRAVENOUS

## 2018-03-24 MED ORDER — EPHEDRINE 5 MG/ML INJ
INTRAVENOUS | Status: AC
  Filled 2018-03-24: qty 10

## 2018-03-24 MED ORDER — HYDROMORPHONE HCL 1 MG/ML IJ SOLN
0.5000 mg | Freq: Once | INTRAMUSCULAR | Status: AC
Start: 2018-03-24 — End: 2018-03-24
  Administered 2018-03-24: 0.5 mg via INTRAVENOUS
  Filled 2018-03-24: qty 1

## 2018-03-24 MED ORDER — SUGAMMADEX SODIUM 200 MG/2ML IV SOLN
INTRAVENOUS | Status: DC | PRN
  Administered 2018-03-24: 175 mg via INTRAVENOUS

## 2018-03-24 MED ORDER — FENTANYL CITRATE (PF) 250 MCG/5ML IJ SOLN
INTRAMUSCULAR | Status: AC
  Filled 2018-03-24: qty 5

## 2018-03-24 MED ORDER — EPHEDRINE SULFATE 50 MG/ML IJ SOLN
INTRAMUSCULAR | Status: DC | PRN
  Administered 2018-03-24 (×2): 15 mg via INTRAVENOUS

## 2018-03-24 MED ORDER — SODIUM CHLORIDE 0.9 % IV SOLN
2.0000 g | INTRAVENOUS | Status: DC
  Administered 2018-03-25: 2 g via INTRAVENOUS
  Filled 2018-03-24 (×2): qty 20

## 2018-03-24 MED ORDER — SODIUM CHLORIDE 0.9 % IR SOLN
Status: DC | PRN
  Administered 2018-03-24: 3000 mL
  Administered 2018-03-24: 1000 mL

## 2018-03-24 MED ORDER — PHENYLEPHRINE 40 MCG/ML (10ML) SYRINGE FOR IV PUSH (FOR BLOOD PRESSURE SUPPORT)
PREFILLED_SYRINGE | INTRAVENOUS | Status: AC
  Filled 2018-03-24: qty 10

## 2018-03-24 MED ORDER — LIDOCAINE HCL (CARDIAC) PF 100 MG/5ML IV SOSY
PREFILLED_SYRINGE | INTRAVENOUS | Status: DC | PRN
  Administered 2018-03-24: 60 mg via INTRAVENOUS

## 2018-03-24 MED ORDER — METRONIDAZOLE IN NACL 5-0.79 MG/ML-% IV SOLN
500.0000 mg | Freq: Three times a day (TID) | INTRAVENOUS | Status: DC
  Administered 2018-03-24 – 2018-03-26 (×5): 500 mg via INTRAVENOUS
  Filled 2018-03-24 (×5): qty 100

## 2018-03-24 MED ORDER — ONDANSETRON HCL 4 MG/2ML IJ SOLN
INTRAMUSCULAR | Status: DC | PRN
  Administered 2018-03-24: 4 mg via INTRAVENOUS

## 2018-03-24 MED ORDER — PROPOFOL 10 MG/ML IV BOLUS
INTRAVENOUS | Status: AC
  Filled 2018-03-24: qty 20

## 2018-03-24 MED ORDER — CEFAZOLIN SODIUM-DEXTROSE 2-4 GM/100ML-% IV SOLN
INTRAVENOUS | Status: AC
  Filled 2018-03-24: qty 100

## 2018-03-24 MED ORDER — ONDANSETRON HCL 4 MG/2ML IJ SOLN
4.0000 mg | Freq: Once | INTRAMUSCULAR | Status: AC
  Administered 2018-03-24: 4 mg via INTRAVENOUS
  Filled 2018-03-24: qty 2

## 2018-03-24 MED ORDER — OXYCODONE HCL 5 MG PO TABS: 5.0000 mg | ORAL_TABLET | Freq: Once | ORAL | Status: DC | PRN

## 2018-03-24 MED ORDER — KETOROLAC TROMETHAMINE 30 MG/ML IJ SOLN
INTRAMUSCULAR | Status: AC
  Filled 2018-03-24: qty 1

## 2018-03-24 MED ORDER — DEXAMETHASONE SODIUM PHOSPHATE 10 MG/ML IJ SOLN
INTRAMUSCULAR | Status: AC
  Filled 2018-03-24: qty 1

## 2018-03-24 MED ORDER — BUPIVACAINE HCL (PF) 0.25 % IJ SOLN
INTRAMUSCULAR | Status: DC | PRN
  Administered 2018-03-24: 15 mL

## 2018-03-24 MED ORDER — MORPHINE SULFATE (PF) 4 MG/ML IV SOLN: 4.0000 mg | INTRAVENOUS | Status: DC | PRN

## 2018-03-24 MED ORDER — MORPHINE SULFATE (PF) 4 MG/ML IV SOLN
4.0000 mg | Freq: Once | INTRAVENOUS | Status: AC
  Administered 2018-03-24: 4 mg via INTRAVENOUS
  Filled 2018-03-24: qty 1

## 2018-03-24 MED ORDER — DIPHENHYDRAMINE HCL 12.5 MG/5ML PO ELIX: 12.5000 mg | ORAL_SOLUTION | Freq: Four times a day (QID) | ORAL | Status: DC | PRN

## 2018-03-24 MED ORDER — 0.9 % SODIUM CHLORIDE (POUR BTL) OPTIME
TOPICAL | Status: DC | PRN
  Administered 2018-03-24: 1000 mL

## 2018-03-24 MED ORDER — PHENYLEPHRINE HCL 10 MG/ML IJ SOLN
INTRAMUSCULAR | Status: DC | PRN
  Administered 2018-03-24 (×2): 200 ug via INTRAVENOUS

## 2018-03-24 MED ORDER — LACTATED RINGERS IV SOLN
INTRAVENOUS | Status: DC | PRN
  Administered 2018-03-24 (×3): via INTRAVENOUS

## 2018-03-24 MED ORDER — PROPOFOL 10 MG/ML IV BOLUS
INTRAVENOUS | Status: DC | PRN
  Administered 2018-03-24: 150 mg via INTRAVENOUS

## 2018-03-24 MED ORDER — FENTANYL CITRATE (PF) 100 MCG/2ML IJ SOLN
INTRAMUSCULAR | Status: DC | PRN
  Administered 2018-03-24 (×3): 50 ug via INTRAVENOUS
  Administered 2018-03-24: 100 ug via INTRAVENOUS

## 2018-03-24 MED ORDER — OXYCODONE HCL 5 MG/5ML PO SOLN: 5.0000 mg | Freq: Once | ORAL | Status: DC | PRN

## 2018-03-24 MED ORDER — FENTANYL CITRATE (PF) 100 MCG/2ML IJ SOLN: 25.0000 ug | INTRAMUSCULAR | Status: DC | PRN

## 2018-03-24 MED ORDER — ONDANSETRON HCL 4 MG/2ML IJ SOLN
INTRAMUSCULAR | Status: AC
  Filled 2018-03-24: qty 2

## 2018-03-24 MED ORDER — SODIUM CHLORIDE 0.9 % IV SOLN
2.0000 g | Freq: Once | INTRAVENOUS | Status: AC
  Administered 2018-03-24: 2 g via INTRAVENOUS
  Filled 2018-03-24: qty 20

## 2018-03-24 MED ORDER — DEXAMETHASONE SODIUM PHOSPHATE 4 MG/ML IJ SOLN
INTRAMUSCULAR | Status: DC | PRN
  Administered 2018-03-24: 10 mg via INTRAVENOUS

## 2018-03-24 MED ORDER — KCL IN DEXTROSE-NACL 20-5-0.45 MEQ/L-%-% IV SOLN
INTRAVENOUS | Status: DC
  Administered 2018-03-24 – 2018-03-26 (×4): via INTRAVENOUS
  Filled 2018-03-24 (×4): qty 1000

## 2018-03-24 MED ORDER — BUPIVACAINE HCL (PF) 0.25 % IJ SOLN
INTRAMUSCULAR | Status: AC
  Filled 2018-03-24: qty 30

## 2018-03-24 MED ORDER — SODIUM CHLORIDE 0.9% FLUSH
3.0000 mL | Freq: Once | INTRAVENOUS | Status: AC
  Administered 2018-03-24: 3 mL via INTRAVENOUS

## 2018-03-24 MED ORDER — DIPHENHYDRAMINE HCL 50 MG/ML IJ SOLN: 12.5000 mg | Freq: Four times a day (QID) | INTRAMUSCULAR | Status: DC | PRN

## 2018-03-24 MED ORDER — HYDRALAZINE HCL 20 MG/ML IJ SOLN: 10.0000 mg | INTRAMUSCULAR | Status: DC | PRN

## 2018-03-24 SURGICAL SUPPLY — 48 items
ADH SKN CLS APL DERMABOND .7 (GAUZE/BANDAGES/DRESSINGS) ×1
APPLIER CLIP ROT 10 11.4 M/L (STAPLE)
APR CLP MED LRG 11.4X10 (STAPLE)
BAG SPEC RTRVL 10 TROC 200 (ENDOMECHANICALS) ×2
BLADE CLIPPER SURG (BLADE) ×2 IMPLANT
CANISTER SUCT 3000ML PPV (MISCELLANEOUS) ×3 IMPLANT
CHLORAPREP W/TINT 26ML (MISCELLANEOUS) ×3 IMPLANT
CLIP APPLIE ROT 10 11.4 M/L (STAPLE) IMPLANT
COVER SURGICAL LIGHT HANDLE (MISCELLANEOUS) ×3 IMPLANT
COVER WAND RF STERILE (DRAPES) ×3 IMPLANT
CUTTER FLEX LINEAR 45M (STAPLE) ×5 IMPLANT
DERMABOND ADVANCED (GAUZE/BANDAGES/DRESSINGS) ×2
DERMABOND ADVANCED .7 DNX12 (GAUZE/BANDAGES/DRESSINGS) ×1 IMPLANT
ELECT REM PT RETURN 9FT ADLT (ELECTROSURGICAL) ×3
ELECTRODE REM PT RTRN 9FT ADLT (ELECTROSURGICAL) ×1 IMPLANT
GLOVE BIO SURGEON STRL SZ8 (GLOVE) ×3 IMPLANT
GLOVE BIOGEL PI IND STRL 8 (GLOVE) ×1 IMPLANT
GLOVE BIOGEL PI INDICATOR 8 (GLOVE) ×2
GOWN STRL REUS W/ TWL LRG LVL3 (GOWN DISPOSABLE) ×2 IMPLANT
GOWN STRL REUS W/ TWL XL LVL3 (GOWN DISPOSABLE) ×1 IMPLANT
GOWN STRL REUS W/TWL LRG LVL3 (GOWN DISPOSABLE) ×6
GOWN STRL REUS W/TWL XL LVL3 (GOWN DISPOSABLE) ×3
KIT BASIN OR (CUSTOM PROCEDURE TRAY) ×3 IMPLANT
KIT TURNOVER KIT B (KITS) ×3 IMPLANT
NEEDLE 22X1 1/2 (OR ONLY) (NEEDLE) ×3 IMPLANT
NS IRRIG 1000ML POUR BTL (IV SOLUTION) ×3 IMPLANT
PAD ARMBOARD 7.5X6 YLW CONV (MISCELLANEOUS) ×6 IMPLANT
POUCH RETRIEVAL ECOSAC 10 (ENDOMECHANICALS) ×1 IMPLANT
POUCH RETRIEVAL ECOSAC 10MM (ENDOMECHANICALS) ×4
RELOAD 45 VASCULAR/THIN (ENDOMECHANICALS) ×3 IMPLANT
RELOAD STAPLE 45 2.5 WHT GRN (ENDOMECHANICALS) IMPLANT
RELOAD STAPLE 45 3.5 BLU ETS (ENDOMECHANICALS) IMPLANT
RELOAD STAPLE TA45 3.5 REG BLU (ENDOMECHANICALS) IMPLANT
SCISSORS LAP 5X35 DISP (ENDOMECHANICALS) IMPLANT
SET IRRIG TUBING LAPAROSCOPIC (IRRIGATION / IRRIGATOR) ×3 IMPLANT
SET TUBE SMOKE EVAC HIGH FLOW (TUBING) ×3 IMPLANT
SHEARS HARMONIC ACE PLUS 36CM (ENDOMECHANICALS) ×3 IMPLANT
SPECIMEN JAR SMALL (MISCELLANEOUS) ×3 IMPLANT
SUT VIC AB 4-0 PS2 27 (SUTURE) ×3 IMPLANT
SUT VICRYL 0 UR6 27IN ABS (SUTURE) ×2 IMPLANT
TOWEL OR 17X24 6PK STRL BLUE (TOWEL DISPOSABLE) ×3 IMPLANT
TOWEL OR 17X26 10 PK STRL BLUE (TOWEL DISPOSABLE) ×3 IMPLANT
TRAY FOLEY CATH SILVER 16FR (SET/KITS/TRAYS/PACK) ×3 IMPLANT
TRAY LAPAROSCOPIC MC (CUSTOM PROCEDURE TRAY) ×3 IMPLANT
TROCAR XCEL 12X100 BLDLESS (ENDOMECHANICALS) ×3 IMPLANT
TROCAR XCEL BLUNT TIP 100MML (ENDOMECHANICALS) ×3 IMPLANT
TROCAR XCEL NON-BLD 5MMX100MML (ENDOMECHANICALS) ×3 IMPLANT
WATER STERILE IRR 1000ML POUR (IV SOLUTION) ×3 IMPLANT

## 2018-03-24 NOTE — Transfer of Care (Signed)
Immediate Anesthesia Transfer of Care Note  Patient: Alfred Gomez  Procedure(s) Performed: APPENDECTOMY LAPAROSCOPIC (N/A Abdomen)  Patient Location: PACU  Anesthesia Type:General  Level of Consciousness: awake, oriented, drowsy and patient cooperative  Airway & Oxygen Therapy: Patient Spontanous Breathing  Post-op Assessment: Report given to RN and Post -op Vital signs reviewed and stable  Post vital signs: Reviewed and stable  Last Vitals:  Vitals Value Taken Time  BP 113/65 03/24/2018  1:14 PM  Temp 36.5 C 03/24/2018  1:13 PM  Pulse 99 03/24/2018  1:18 PM  Resp 22 03/24/2018  1:18 PM  SpO2 85 % 03/24/2018  1:18 PM  Vitals shown include unvalidated device data.  Last Pain:  Vitals:   03/24/18 1313  TempSrc:   PainSc: 0-No pain         Complications: No apparent anesthesia complications

## 2018-03-24 NOTE — Anesthesia Procedure Notes (Signed)
Procedure Name: Intubation Date/Time: 03/24/2018 12:03 PM Performed by: Teressa Lower., CRNA Pre-anesthesia Checklist: Patient identified, Emergency Drugs available, Suction available and Patient being monitored Patient Re-evaluated:Patient Re-evaluated prior to induction Oxygen Delivery Method: Circle system utilized Preoxygenation: Pre-oxygenation with 100% oxygen Induction Type: IV induction Ventilation: Mask ventilation without difficulty and Oral airway inserted - appropriate to patient size Laryngoscope Size: Sabra Heck and 3 Grade View: Grade I Tube type: Oral Tube size: 7.5 mm Number of attempts: 1 Airway Equipment and Method: Stylet and Oral airway Placement Confirmation: ETT inserted through vocal cords under direct vision,  positive ETCO2 and breath sounds checked- equal and bilateral Secured at: 23 cm Tube secured with: Tape Dental Injury: Teeth and Oropharynx as per pre-operative assessment

## 2018-03-24 NOTE — ED Notes (Signed)
Patient transported to CT 

## 2018-03-24 NOTE — Progress Notes (Signed)
Patient ID: Alfred Gomez, male   DOB: 05-Jul-1950, 68 y.o.   MRN: [id number] Patient examined. I agree with Dr. Hassell Done. Will proceed with laparoscopic appendectomy. I discussed the procedure, risks, and benefits with him and his wife. They agree.  Georganna Skeans, MD, MPH, FACS Trauma: [phone] General Surgery: [phone]

## 2018-03-24 NOTE — ED Notes (Signed)
Patient aware of need for a urine specimen. Unable to provide one at this time. Urinal bedside.

## 2018-03-24 NOTE — ED Triage Notes (Signed)
Right lower quad abdominal pain that started this evening while working.  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  Per wife temp was 94.6 initially.  Also reports nausea but no vomiting.

## 2018-03-24 NOTE — H&P (Signed)
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  Seen in the ER this am and CT scan showed appendicitis.  No prior abdominal surgery.  Primary is Dr. Maury Dus.    Past Medical History:  Diagnosis Date  . Hypertension   . Thyroid condition     Past Surgical History:  Procedure Laterality Date  . MOUTH SURGERY    . SHOULDER SURGERY      Current Facility-Administered Medications  Medication Dose Route Frequency Provider Last Rate Last Dose  . metroNIDAZOLE (FLAGYL) IVPB 500 mg  500 mg Intravenous Once Rodell Perna A, PA-C       Current Outpatient Medications  Medication Sig Dispense Refill  . cyclobenzaprine (FLEXERIL) 5 MG tablet Take 1 tablet (5 mg total) by mouth 3 (three) times daily as needed for muscle spasms. 30 tablet 0  . ezetimibe (ZETIA) 10 MG tablet Take 10 mg by mouth daily.    . irbesartan-hydrochlorothiazide (AVALIDE) 150-12.5 MG tablet Take 1 tablet by mouth daily.    [name] levothyroxine (SYNTHROID, LEVOTHROID) 150 MCG tablet Take 150 mcg by mouth daily before breakfast.    . rosuvastatin (CRESTOR) 20 MG tablet Take 20 mg by mouth daily.    [name] ibuprofen (ADVIL,MOTRIN) 800 MG tablet Take 1 tablet (800 mg total) by mouth 3 (three) times daily. (Patient not taking: Reported on 03/24/2018) 21 tablet 0   Patient has no known allergies. No family history on file. Social History:   reports that he has been smoking cigarettes. He has been smoking about 0.50 packs per day. He has never used smokeless tobacco. He reports that he does not drink alcohol or use drugs.   REVIEW OF SYSTEMS : Negative except for prior right shoulder surgery;  Hx of diverticulitis  Physical Exam:   Blood pressure 116/69, pulse 76, temperature (!) 97.5 F (36.4 C), temperature source Oral, resp.  rate 18, height 5\' 8"  (1.727 m), weight 78 kg, SpO2 97 %. Body mass index is 26.15 kg/m.  Gen:  WDWN WM NAD  Neurological: Alert and oriented to person, place, and time. Motor and sensory function is grossly intact  Head: Normocephalic and atraumatic.  Eyes: Conjunctivae are normal. Pupils are equal, round, and reactive to light. No scleral icterus.  Neck: Normal range of motion. Neck supple. No tracheal deviation or thyromegaly present.  Cardiovascular:  SR without murmurs or gallops.  No carotid bruits Breast:  Not examined Respiratory: Effort normal.  No respiratory distress. No chest wall tenderness. Breath sounds normal.  No wheezes, rales or rhonchi.  Abdomen:  Tender at McBurney's point GU:  Not examined Musculoskeletal: Normal range of motion. Extremities are nontender. No cyanosis, edema or clubbing noted Lymphadenopathy: No cervical, preauricular, postauricular or axillary adenopathy is present Skin: Skin is warm and dry. No rash noted. No diaphoresis. No erythema. No pallor. Pscyh: Normal mood and affect. Behavior is normal. Judgment and thought content normal.   LABORATORY RESULTS: Results for orders placed or performed during the hospital encounter of 03/24/18 (from the past 48 hour(s))  Lipase, blood     Status: None   Collection Time: 03/24/18  6:04 AM  Result Value Ref Range   Lipase 27 11 - 51 U/L    Comment: Performed at [facility], [place]  [address], [place], [place] [address]  Comprehensive metabolic panel     Status: Abnormal   Collection Time: 03/24/18  6:04 AM  Result Value Ref Range   Sodium 138 135 - 145 mmol/L   Potassium 3.8 3.5 - 5.1 mmol/L   Chloride 102 98 - 111 mmol/L   CO2 24 22 - 32 mmol/L   Glucose, Bld 144 (H) 70 - 99 mg/dL   BUN 14 8 - 23 mg/dL   Creatinine, Ser 0.94 0.61 - 1.24 mg/dL   Calcium 9.3 8.9 - 10.3 mg/dL   Total Protein 7.5 6.5 - 8.1 g/dL   Albumin 4.0 3.5 - 5.0 g/dL   AST 25 15 - 41 U/L   ALT 29 0 - 44 U/L   Alkaline  Phosphatase 52 38 - 126 U/L   Total Bilirubin 0.9 0.3 - 1.2 mg/dL   GFR calc non Af Amer >60 >60 mL/min   GFR calc Af Amer >60 >60 mL/min   Anion gap 12 5 - 15    Comment: Performed at [facility] [address], [place] [address]  CBC     Status: None   Collection Time: 03/24/18  6:04 AM  Result Value Ref Range   WBC 7.3 4.0 - 10.5 K/uL   RBC 5.06 4.22 - 5.81 MIL/uL   Hemoglobin 15.4 13.0 - 17.0 g/dL   HCT 46.2 39.0 - 52.0 %   MCV 91.3 80.0 - 100.0 fL   MCH 30.4 26.0 - 34.0 pg   MCHC 33.3 30.0 - 36.0 g/dL   RDW 13.4 11.5 - 15.5 %   Platelets 221 150 - 400 K/uL   nRBC 0.0 0.0 - 0.2 %    Comment: Performed at [facility], [place] [address] [place], [place] [address]     RADIOLOGY RESULTS: Ct Abdomen Pelvis W Contrast  Result Date: 03/24/2018 CLINICAL DATA:  Acute right lower quadrant abdominal pain. EXAM: CT ABDOMEN AND PELVIS WITH CONTRAST TECHNIQUE: Multidetector CT imaging of the abdomen and pelvis was performed using the standard protocol following bolus administration of intravenous contrast. CONTRAST:  132mL OMNIPAQUE IOHEXOL 300 MG/ML  SOLN COMPARISON:  None. FINDINGS: Lower chest: No acute abnormality. Hepatobiliary: No focal liver abnormality is seen. No gallstones, gallbladder wall thickening, or biliary dilatation. Pancreas: Unremarkable. No pancreatic ductal dilatation or surrounding inflammatory changes. Spleen: Normal in size without focal abnormality. Adrenals/Urinary Tract: Adrenal glands appear normal. Bilateral renal cysts are noted. No hydronephrosis or renal obstruction is noted. Urinary bladder is unremarkable. No renal or ureteral calculi are noted. Stomach/Bowel: The stomach appears normal. There is no evidence of bowel obstruction. The appendix is enlarged with surrounding inflammation consistent with acute appendicitis. Appendix: Location: Retrocecal. Diameter: 10 mm. Appendicolith: No. Mucosal hyper-enhancement: Yes. Extraluminal gas: No.  Periappendiceal collection: No. Vascular/Lymphatic: Aortic atherosclerosis. No enlarged abdominal or pelvic lymph nodes. Reproductive: Mild prostatic enlargement is noted. Other: No abdominal wall hernia or abnormality. No abdominopelvic ascites. Musculoskeletal: No acute or significant osseous findings. IMPRESSION: Findings consistent with acute appendicitis.  No abscess is noted. Aortic Atherosclerosis (ICD10-I70.0). Electronically Signed   By: Marijo Conception, M.D.   On: 03/24/2018 08:32    Problem List: Patient Active Problem List   Diagnosis Date Noted  . Appendicitis 03/24/2018    Assessment & Plan: Acute appendicitis-Plan lap appendectomy per Dr. Georganna Skeans.      Matt B. Hassell Done, MD, [facility] [phone] beeper [phone]  03/24/2018 10:12 AM

## 2018-03-24 NOTE — ED Provider Notes (Signed)
[facility] EMERGENCY DEPARTMENT Provider Note   CSN: [id number] Arrival date & time: 03/24/18  0532     History   Chief Complaint Chief Complaint  Patient presents with  . Abdominal Pain    HPI Alfred Gomez is a 68 y.o. male with history of hypertension, hyperlipidemia, thyroid disorder presents for evaluation of acute onset, progressively worsening right lower quadrant abdominal pain beginning yesterday at around 8 PM.  Pain radiates to the right flank, worsens with movement, turning, cough.  He notes nausea but no vomiting.  Denies diarrhea, constipation, melena, or hematochezia but has not had a bowel movement since his symptoms began.  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  He has not tried anything for his symptoms.  Denies chest pain, shortness of breath, urinary symptoms.  Denies suspicious food intake.  No history of abdominal surgeries.  The history is provided by the patient.    Past Medical History:  Diagnosis Date  . Hypertension   . Thyroid condition     Patient Active Problem List   Diagnosis Date Noted  . Appendicitis 03/24/2018    Past Surgical History:  Procedure Laterality Date  . MOUTH SURGERY    . SHOULDER SURGERY          Home Medications    Prior to Admission medications   Medication Sig Start Date End Date Taking? Authorizing Provider  cyclobenzaprine (FLEXERIL) 5 MG tablet Take 1 tablet (5 mg total) by mouth 3 (three) times daily as needed for muscle spasms. 12/07/17  Yes Raylene Everts, MD  ezetimibe (ZETIA) 10 MG tablet Take 10 mg by mouth daily.   Yes [provider]  irbesartan-hydrochlorothiazide (AVALIDE) 150-12.5 MG tablet Take 1 tablet by mouth daily.   Yes [provider]  levothyroxine (SYNTHROID, LEVOTHROID) 150 MCG tablet Take 150 mcg by mouth daily before breakfast.    Yes [provider]  rosuvastatin (CRESTOR) 20 MG tablet Take 20 mg by mouth daily.   Yes [provider]  ibuprofen (ADVIL,MOTRIN) 800 MG tablet Take 1 tablet (800 mg total) by mouth 3 (three) times daily. Patient not taking: Reported on 03/24/2018 12/07/17   Raylene Everts, MD    Family History No family history on file.  Social History Social History   Tobacco Use  . Smoking status: Current Every Day Smoker    Packs/day: 0.50    Types: Cigarettes  . Smokeless tobacco: Never Used  Substance Use Topics  . Alcohol use: Never    Frequency: Never  . Drug use: Never     Allergies   Patient has no known allergies.   Review of Systems Review of Systems  Constitutional: Positive for chills. Negative for fever.  Respiratory: Negative for shortness of breath.   Cardiovascular: Negative for chest pain.  Gastrointestinal: Positive for abdominal pain and nausea. Negative for constipation, diarrhea and vomiting.  All other systems reviewed and are negative.    Physical Exam Updated Vital Signs BP (!) 115/59   Pulse 78   Temp (!) 97.5 F (36.4 C) (Oral)   Resp (!) 22   Ht 5\' 8"  (1.727 m)   Wt 78 kg   SpO2 94%   BMI 26.15 kg/m   Physical Exam Vitals signs and nursing note reviewed.  Constitutional:      General: He is  not in acute distress.    Appearance: He is well-developed.  HENT:     Head: Normocephalic and atraumatic.  Eyes:     General:        Right eye: No discharge.        Left eye: No discharge.     Conjunctiva/sclera: Conjunctivae normal.  Neck:     Vascular: No JVD.     Trachea: No tracheal deviation.  Cardiovascular:     Rate and Rhythm: Normal rate.     Heart sounds: Normal heart sounds.  Pulmonary:     Effort: Pulmonary effort is normal.     Breath sounds: Normal breath sounds.  Abdominal:     General: Abdomen is protuberant. Bowel sounds are normal. There is no distension.     Palpations: Abdomen is soft.      Tenderness: There is abdominal tenderness in the right upper quadrant and right lower quadrant. There is guarding and rebound. There is no right CVA tenderness or left CVA tenderness. Positive signs include psoas sign. Negative signs include Murphy's sign, Rovsing's sign, McBurney's sign and obturator sign.  Skin:    General: Skin is warm and dry.     Findings: No erythema.  Neurological:     Mental Status: He is alert.  Psychiatric:        Behavior: Behavior normal.      ED Treatments / Results  Labs (all labs ordered are listed, but only abnormal results are displayed) Labs Reviewed  COMPREHENSIVE METABOLIC PANEL - Abnormal; Notable for the following components:      Result Value   Glucose, Bld 144 (*)    All other components within normal limits  URINALYSIS, ROUTINE W REFLEX MICROSCOPIC - Abnormal; Notable for the following components:   Specific Gravity, Urine >1.046 (*)    All other components within normal limits  LIPASE, BLOOD  CBC    EKG EKG Interpretation  Date/Time:  Sunday March 24 2018 05:54:13 EST Ventricular Rate:  74 PR Interval:    QRS Duration: 111 QT Interval:  372 QTC Calculation: 413 R Axis:   -62 Text Interpretation:  Sinus rhythm Incomplete left bundle branch block Probable left ventricular hypertrophy No previous tracing Confirmed by Orpah Greek [phone]) on 03/24/2018 5:58:33 AM   Radiology Ct Abdomen Pelvis W Contrast  Result Date: 03/24/2018 CLINICAL DATA:  Acute right lower quadrant abdominal pain. EXAM: CT ABDOMEN AND PELVIS WITH CONTRAST TECHNIQUE: Multidetector CT imaging of the abdomen and pelvis was performed using the standard protocol following bolus administration of intravenous contrast. CONTRAST:  170mL OMNIPAQUE IOHEXOL 300 MG/ML  SOLN COMPARISON:  None. FINDINGS: Lower chest: No acute abnormality. Hepatobiliary: No focal liver abnormality is seen. No gallstones, gallbladder wall thickening, or biliary dilatation. Pancreas:  Unremarkable. No pancreatic ductal dilatation or surrounding inflammatory changes. Spleen: Normal in size without focal abnormality. Adrenals/Urinary Tract: Adrenal glands appear normal. Bilateral renal cysts are noted. No hydronephrosis or renal obstruction is noted. Urinary bladder is unremarkable. No renal or ureteral calculi are noted. Stomach/Bowel: The stomach appears normal. There is no evidence of bowel obstruction. The appendix is enlarged with surrounding inflammation consistent with acute appendicitis. Appendix: Location: Retrocecal. Diameter: 10 mm. Appendicolith: No. Mucosal hyper-enhancement: Yes. Extraluminal gas: No. Periappendiceal collection: No. Vascular/Lymphatic: Aortic atherosclerosis. No enlarged abdominal or pelvic lymph nodes. Reproductive: Mild prostatic enlargement is noted. Other: No abdominal wall hernia or abnormality. No abdominopelvic ascites. Musculoskeletal: No acute or significant osseous findings. IMPRESSION: Findings consistent with acute appendicitis.  No abscess  is noted. Aortic Atherosclerosis (ICD10-I70.0). Electronically Signed   By: Marijo Conception, M.D.   On: 03/24/2018 08:32    Procedures Procedures (including critical care time)  Medications Ordered in ED Medications  cefTRIAXone (ROCEPHIN) 2 g in sodium chloride 0.9 % 100 mL IVPB (0 g Intravenous Stopped 03/24/18 1022)    And  metroNIDAZOLE (FLAGYL) IVPB 500 mg (500 mg Intravenous New Bag/Given 03/24/18 1029)  sodium chloride flush (NS) 0.9 % injection 3 mL (3 mLs Intravenous Given 03/24/18 0603)  morphine 4 MG/ML injection 4 mg (4 mg Intravenous Given 03/24/18 0651)  ondansetron (ZOFRAN) injection 4 mg (4 mg Intravenous Given 03/24/18 0647)  iohexol (OMNIPAQUE) 300 MG/ML solution 100 mL (100 mLs Intravenous Contrast Given 03/24/18 0810)  HYDROmorphone (DILAUDID) injection 0.5 mg (0.5 mg Intravenous Given 03/24/18 0900)     Initial Impression / Assessment and Plan / ED Course  I have reviewed the triage vital  signs and the nursing notes.  Pertinent labs & imaging results that were available during my care of the patient were reviewed by me and considered in my medical decision making (see chart for details).    Patient with right lower quadrant abdominal pain.  He is afebrile, vital signs are stable.  He is nontoxic in appearance.  Lab work reviewed by me shows no leukocytosis, no anemia, no metabolic derangements.  No evidence of UTI or nephrolithiasis on UA.  LFTs, lipase, creatinine within normal limits.  CT scan shows evidence of acute appendicitis with no abscess or perforation.  Spoke with Janett Billow, [place] with general surgery.  They will assume care of the patient and bring him into the hospital with plan for appendectomy.  Patient received IV antibiotics in the ED.  His pain has improved somewhat on reevaluation.  Final Clinical Impressions(s) / ED Diagnoses   Final diagnoses:  Acute appendicitis with generalized peritonitis    ED Discharge Orders    None       Renita Papa, PA-C 03/24/18 1058    Orpah Greek, MD 03/25/18 0600

## 2018-03-24 NOTE — Anesthesia Preprocedure Evaluation (Signed)
Anesthesia Evaluation  Patient identified by MRN, date of birth, ID band Patient awake    Reviewed: Allergy & Precautions, H&P , NPO status , Patient's Chart, lab work & pertinent test results  Airway Mallampati: II   Neck ROM: full    Dental   Pulmonary Current Smoker,    breath sounds clear to auscultation       Cardiovascular hypertension,  Rhythm:regular Rate:Normal     Neuro/Psych    GI/Hepatic   Endo/Other    Renal/GU      Musculoskeletal   Abdominal   Peds  Hematology   Anesthesia Other Findings   Reproductive/Obstetrics                             Anesthesia Physical Anesthesia Plan  ASA: II  Anesthesia Plan: General   Post-op Pain Management:    Induction: Intravenous  PONV Risk Score and Plan: 1 and Ondansetron, Dexamethasone, Midazolam and Treatment may vary due to age or medical condition  Airway Management Planned: Oral ETT  Additional Equipment:   Intra-op Plan:   Post-operative Plan: Extubation in OR  Informed Consent: I have reviewed the patients History and Physical, chart, labs and discussed the procedure including the risks, benefits and alternatives for the proposed anesthesia with the patient or authorized representative who has indicated his/her understanding and acceptance.       Plan Discussed with: CRNA, Anesthesiologist and Surgeon  Anesthesia Plan Comments:         Anesthesia Quick Evaluation

## 2018-03-24 NOTE — Op Note (Signed)
03/24/2018  1:08 PM  PATIENT:  Alfred Gomez  68 y.o. male  PRE-OPERATIVE DIAGNOSIS:  appendicitis  POST-OPERATIVE DIAGNOSIS:  appendicitis with localized perforation  PROCEDURE:  Procedure(s): APPENDECTOMY LAPAROSCOPIC  SURGEON:  Surgeon(s): Georganna Skeans, MD  ASSISTANTS: none   ANESTHESIA:   local and general  EBL:  Total I/O In: 1600 [I.V.:1500; IV Piggyback:100] Out: 73 [Blood:50]  BLOOD ADMINISTERED:none  DRAINS: none   SPECIMEN:  Excision  DISPOSITION OF SPECIMEN:  PATHOLOGY  COUNTS:  YES  DICTATION: .Dragon Dictation Findings: appendicitis with localized perforation  Procedure in detail: [name] presents for appendectomy.  He was identified in the preop holding area.  Informed consent was obtained.  He received intravenous antibiotics.  He was brought to the operating room and general endotracheal anesthesia was administered by the anesthesia staff.  His abdomen was prepped and draped in a sterile fashion.  Timeout procedure was performed.The infraumbilical region was infiltrated with local. Infraumbilical incision was made. Subcutaneous tissues were dissected down revealing the anterior fascia. This was divided sharply along the midline. Peritoneal cavity was entered under direct vision without complication. A 0 Vicryl pursestring was placed around the fascial opening. Hassan trocar was inserted into the abdomen. The abdomen was insufflated with carbon dioxide in standard fashion. Under direct vision a 5 mm right upper quadrant port and a 12 mm right lower quadrant port were placed as the cecum was noted to be in the mid lateral right abdomen.  The cecum was inspected there was moderate inflammation.  The terminal ileum was tethered to the lateral sidewall and this was gently freed up using the harmonic scalpel.  This identified the appendix which had a localized perforation.  The base was dissected and divided with Endo GIA with a vascular load.  I divided the  mesoappendix using the harmonic scalpel achieving excellent hemostasis.  The appendix extended a bit retrocecal and I dissected out bluntly.  It was placed in a bag and sent to pathology.  The area was copiously irrigated with 2 L of saline.  Irrigation returned clear and there was good hemostasis.  The staple line was intact on the cecum.  Ports removed under direct vision.  Pneumoperitoneum was released.  Infraumbilical fascia was closed by tying the pursestring.  I put an additional interrupted 0 Vicryl x2 to reinforce the closure.  All 3 wounds were irrigated and the skin was closed with 4-0 Vicryl followed by Dermabond.  All counts were correct.  He tolerated the procedure well without apparent complication and was taken recovery in stable condition.  PATIENT DISPOSITION:  PACU - hemodynamically stable.   Delay start of Pharmacological VTE agent (>24hrs) due to surgical blood loss or risk of bleeding:  no  Georganna Skeans, MD, MPH, FACS Pager: [phone]  2/2/20201:08 PM

## 2018-03-25 ENCOUNTER — Other Ambulatory Visit: Payer: Self-pay

## 2018-03-25 ENCOUNTER — Encounter (HOSPITAL_COMMUNITY): Payer: Self-pay | Admitting: General Practice

## 2018-03-25 DIAGNOSIS — K3533 Acute appendicitis with perforation and localized peritonitis, with abscess: Secondary | ICD-10-CM | POA: Diagnosis not present

## 2018-03-25 LAB — CBC
HEMATOCRIT: 36.4 % — AB (ref 39.0–52.0)
Hemoglobin: 12.2 g/dL — ABNORMAL LOW (ref 13.0–17.0)
MCH: 31 pg (ref 26.0–34.0)
MCHC: 33.5 g/dL (ref 30.0–36.0)
MCV: 92.4 fL (ref 80.0–100.0)
Platelets: 180 10*3/uL (ref 150–400)
RBC: 3.94 MIL/uL — ABNORMAL LOW (ref 4.22–5.81)
RDW: 13.3 % (ref 11.5–15.5)
WBC: 11.6 10*3/uL — ABNORMAL HIGH (ref 4.0–10.5)
nRBC: 0 % (ref 0.0–0.2)

## 2018-03-25 LAB — BASIC METABOLIC PANEL
Anion gap: 8 (ref 5–15)
BUN: 15 mg/dL (ref 8–23)
CO2: 25 mmol/L (ref 22–32)
Calcium: 8.7 mg/dL — ABNORMAL LOW (ref 8.9–10.3)
Chloride: 105 mmol/L (ref 98–111)
Creatinine, Ser: 0.99 mg/dL (ref 0.61–1.24)
GFR calc Af Amer: >60 mL/min (ref >60)
GFR calc non Af Amer: >60 mL/min (ref >60)
Glucose, Bld: 170 mg/dL — ABNORMAL HIGH (ref 70–99)
Potassium: 4.9 mmol/L (ref 3.5–5.1)
Sodium: 138 mmol/L (ref 135–145)

## 2018-03-25 MED ORDER — IBUPROFEN 600 MG PO TABS: 600.0000 mg | ORAL_TABLET | Freq: Four times a day (QID) | ORAL | Status: DC | PRN

## 2018-03-25 MED ORDER — LEVOTHYROXINE SODIUM 75 MCG PO TABS
150.0000 ug | ORAL_TABLET | Freq: Every day | ORAL | Status: DC
  Administered 2018-03-25 – 2018-03-26 (×2): 150 ug via ORAL
  Filled 2018-03-25 (×2): qty 2

## 2018-03-25 MED ORDER — IRBESARTAN-HYDROCHLOROTHIAZIDE 150-12.5 MG PO TABS: 1.0000 | ORAL_TABLET | Freq: Every day | ORAL | Status: DC

## 2018-03-25 MED ORDER — ACETAMINOPHEN 325 MG PO TABS: 650.0000 mg | ORAL_TABLET | Freq: Four times a day (QID) | ORAL | Status: DC | PRN

## 2018-03-25 MED ORDER — ROSUVASTATIN CALCIUM 20 MG PO TABS
20.0000 mg | ORAL_TABLET | Freq: Every day | ORAL | Status: DC
  Administered 2018-03-25: 20 mg via ORAL
  Filled 2018-03-25: qty 1

## 2018-03-25 MED ORDER — IRBESARTAN 150 MG PO TABS
150.0000 mg | ORAL_TABLET | Freq: Every day | ORAL | Status: DC
  Administered 2018-03-25: 150 mg via ORAL
  Filled 2018-03-25: qty 1

## 2018-03-25 MED ORDER — EZETIMIBE 10 MG PO TABS
10.0000 mg | ORAL_TABLET | Freq: Every day | ORAL | Status: DC
  Administered 2018-03-25: 10 mg via ORAL
  Filled 2018-03-25 (×2): qty 1

## 2018-03-25 MED ORDER — MORPHINE SULFATE (PF) 2 MG/ML IV SOLN: 2.0000 mg | INTRAVENOUS | Status: DC | PRN

## 2018-03-25 MED ORDER — HYDROCHLOROTHIAZIDE 12.5 MG PO CAPS
12.5000 mg | ORAL_CAPSULE | Freq: Every day | ORAL | Status: DC
  Administered 2018-03-25: 12.5 mg via ORAL
  Filled 2018-03-25: qty 1

## 2018-03-25 NOTE — Progress Notes (Signed)
Patient ID: Alfred Gomez, male   DOB: Oct 24, 1950, 68 y.o.   MRN: [id number]    1 Day Post-Op  Subjective: CC: Abdominal Pain Some abdominal pain overnight. No N/V. Tolerating regular diet. Passing flatus. Mobilizing and using IS. No fevers overnight.  Objective: Vital signs in last 24 hours: Temp:  [97.7 F (36.5 C)-98.7 F (37.1 C)] 98.3 F (36.8 C) (02/03 0601) Pulse Rate:  [74-99] 74 (02/03 0601) Resp:  [18-22] 18 (02/03 0601) BP: (101-121)/(59-68) 106/63 (02/03 0601) SpO2:  [90 %-96 %] 93 % (02/03 0601) Weight:  [83.9 kg] 83.9 kg (02/02 1407) Last BM Date: 03/24/18  Intake/Output from previous day: 02/02 0701 - 02/03 0700 In: 3592.7 [P.O.:720; I.V.:2579.6; IV Piggyback:293.1] Out: 2275 [Urine:2225; Blood:50] Intake/Output this shift: No intake/output data recorded.  Physical Exam: Gen: WD, WN, NAD Heart: RRR Lungs: CTA b/l Abd: Soft, ND. Appropriatley tender. Ecchymosis noted around umbilical incision, otherwise incisions c/d/i Msk: No edema  Lab Results:  Recent Labs    03/24/18 0604 03/25/18 0301  WBC 7.3 11.6*  HGB 15.4 12.2*  HCT 46.2 36.4*  PLT 221 180   BMET Recent Labs    03/24/18 0604 03/25/18 0301  NA 138 138  K 3.8 4.9  CL 102 105  CO2 24 25  GLUCOSE 144* 170*  BUN 14 15  CREATININE 0.94 0.99  CALCIUM 9.3 8.7*   PT/INR No results for input(s): LABPROT, INR in the last 72 hours. CMP     Component Value Date/Time   NA 138 03/25/2018 0301   K 4.9 03/25/2018 0301   CL 105 03/25/2018 0301   CO2 25 03/25/2018 0301   GLUCOSE 170 (H) 03/25/2018 0301   BUN 15 03/25/2018 0301   CREATININE 0.99 03/25/2018 0301   CALCIUM 8.7 (L) 03/25/2018 0301   PROT 7.5 03/24/2018 0604   ALBUMIN 4.0 03/24/2018 0604   AST 25 03/24/2018 0604   ALT 29 03/24/2018 0604   ALKPHOS 52 03/24/2018 0604   BILITOT 0.9 03/24/2018 0604   GFRNONAA >60 03/25/2018 0301   GFRAA >60 03/25/2018 0301   Lipase     Component Value Date/Time   LIPASE 27 03/24/2018 0604         Studies/Results: Ct Abdomen Pelvis W Contrast  Result Date: 03/24/2018 CLINICAL DATA:  Acute right lower quadrant abdominal pain. EXAM: CT ABDOMEN AND PELVIS WITH CONTRAST TECHNIQUE: Multidetector CT imaging of the abdomen and pelvis was performed using the standard protocol following bolus administration of intravenous contrast. CONTRAST:  150mL OMNIPAQUE IOHEXOL 300 MG/ML  SOLN COMPARISON:  None. FINDINGS: Lower chest: No acute abnormality. Hepatobiliary: No focal liver abnormality is seen. No gallstones, gallbladder wall thickening, or biliary dilatation. Pancreas: Unremarkable. No pancreatic ductal dilatation or surrounding inflammatory changes. Spleen: Normal in size without focal abnormality. Adrenals/Urinary Tract: Adrenal glands appear normal. Bilateral renal cysts are noted. No hydronephrosis or renal obstruction is noted. Urinary bladder is unremarkable. No renal or ureteral calculi are noted. Stomach/Bowel: The stomach appears normal. There is no evidence of bowel obstruction. The appendix is enlarged with surrounding inflammation consistent with acute appendicitis. Appendix: Location: Retrocecal. Diameter: 10 mm. Appendicolith: No. Mucosal hyper-enhancement: Yes. Extraluminal gas: No. Periappendiceal collection: No. Vascular/Lymphatic: Aortic atherosclerosis. No enlarged abdominal or pelvic lymph nodes. Reproductive: Mild prostatic enlargement is noted. Other: No abdominal wall hernia or abnormality. No abdominopelvic ascites. Musculoskeletal: No acute or significant osseous findings. IMPRESSION: Findings consistent with acute appendicitis.  No abscess is noted. Aortic Atherosclerosis (ICD10-I70.0). Electronically Signed   By: Sabino Dick  Brooke Bonito, M.D.   On: 03/24/2018 08:32    Anti-infectives: Anti-infectives (From admission, onward)   Start     Dose/Rate Route Frequency Ordered Stop   03/25/18 1000  cefTRIAXone (ROCEPHIN) 2 g in sodium chloride 0.9 % 100 mL IVPB     2 g 200 mL/hr over  30 Minutes Intravenous Every 24 hours 03/24/18 1405     03/24/18 2143  ceFAZolin (ANCEF) 2-4 GM/100ML-% IVPB  Status:  Discontinued    Note to Pharmacy:  Valda Lamb   : cabinet override      03/24/18 2143 03/24/18 2154   03/24/18 1830  metroNIDAZOLE (FLAGYL) IVPB 500 mg     500 mg 100 mL/hr over 60 Minutes Intravenous Every 8 hours 03/24/18 1405     03/24/18 0845  cefTRIAXone (ROCEPHIN) 2 g in sodium chloride 0.9 % 100 mL IVPB     2 g 200 mL/hr over 30 Minutes Intravenous  Once 03/24/18 0841 03/24/18 1022   03/24/18 0845  metroNIDAZOLE (FLAGYL) IVPB 500 mg     500 mg 100 mL/hr over 60 Minutes Intravenous  Once 03/24/18 0841 03/24/18 1129       Assessment/Plan POD 1, s/p Lap Appy for acute appendicitis w/ localized perforation, Dr. Grandville Silos - 2/2 - 5 days total abx - Doing well. Possible discharge later today or tomorrow - Mobilize and IS  HTN - Reordered home meds  Hypothyroidism - Reordered home meds  FEN - Regular VTE - Lovenox  ID - Rocephin & Flagyl 2/2 >>   LOS: 0 days    Jillyn Ledger , [facility] Surgery 03/25/2018, 8:47 AM Pager: [phone]

## 2018-03-26 ENCOUNTER — Encounter (HOSPITAL_COMMUNITY): Payer: Self-pay | Admitting: General Surgery

## 2018-03-26 DIAGNOSIS — K3533 Acute appendicitis with perforation and localized peritonitis, with abscess: Secondary | ICD-10-CM | POA: Diagnosis not present

## 2018-03-26 LAB — CBC
HCT: 34.6 % — ABNORMAL LOW (ref 39.0–52.0)
Hemoglobin: 11.7 g/dL — ABNORMAL LOW (ref 13.0–17.0)
MCH: 30.9 pg (ref 26.0–34.0)
MCHC: 33.8 g/dL (ref 30.0–36.0)
MCV: 91.3 fL (ref 80.0–100.0)
NRBC: 0 % (ref 0.0–0.2)
Platelets: 172 10*3/uL (ref 150–400)
RBC: 3.79 MIL/uL — ABNORMAL LOW (ref 4.22–5.81)
RDW: 13.4 % (ref 11.5–15.5)
WBC: 9 10*3/uL (ref 4.0–10.5)

## 2018-03-26 LAB — BASIC METABOLIC PANEL
Anion gap: 3 — ABNORMAL LOW (ref 5–15)
BUN: 17 mg/dL (ref 8–23)
CHLORIDE: 107 mmol/L (ref 98–111)
CO2: 29 mmol/L (ref 22–32)
Calcium: 8.5 mg/dL — ABNORMAL LOW (ref 8.9–10.3)
Creatinine, Ser: 0.87 mg/dL (ref 0.61–1.24)
GFR calc non Af Amer: >60 mL/min (ref >60)
Glucose, Bld: 156 mg/dL — ABNORMAL HIGH (ref 70–99)
Potassium: 4 mmol/L (ref 3.5–5.1)
Sodium: 139 mmol/L (ref 135–145)

## 2018-03-26 MED ORDER — POLYETHYLENE GLYCOL 3350 17 G PO PACK: 17.0000 g | PACK | Freq: Every day | ORAL | 0 refills | Status: DC | PRN

## 2018-03-26 MED ORDER — DOCUSATE SODIUM 100 MG PO CAPS: 100.0000 mg | ORAL_CAPSULE | Freq: Every day | ORAL | 2 refills | Status: AC | PRN

## 2018-03-26 MED ORDER — OXYCODONE HCL 5 MG PO TABS: 5.0000 mg | ORAL_TABLET | Freq: Four times a day (QID) | ORAL | 0 refills | Status: DC | PRN

## 2018-03-26 MED ORDER — AMOXICILLIN-POT CLAVULANATE 875-125 MG PO TABS: 1.0000 | ORAL_TABLET | Freq: Two times a day (BID) | ORAL | 0 refills | Status: DC

## 2018-03-26 NOTE — Discharge Summary (Signed)
    Patient ID: Alfred Gomez [id number] Sep 26, 1950 68 y.o.  Admit date: 03/24/2018 Discharge date: 03/26/2018  Admitting Diagnosis: Acute appendicitis  Discharge Diagnosis Patient Active Problem List   Diagnosis Date Noted  . Appendicitis 03/24/2018  . 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  Seen in the ER this am and CT scan showed appendicitis.  No prior abdominal surgery.  Primary is Dr. Maury Dus.    Procedures Laparoscopic Appendectomy  Hospital Course:  The patient was admitted and underwent a laparoscopic appendectomy. There was a localized area of perforation for which he was started on a 5 day course of abx. The patient tolerated the procedure well.  On POD 2, the patient was tolerating a regular diet, voiding well, mobilizing, and pain was controlled with oral pain medications. WBC normalized prior to discharge. He was sent home on additional 3 days of Augmentin to complete his 5 day course. The patient was stable for DC home at this time with appropriate follow up made.   Physical Exam: Gen: WD, WN, NAD Heart: RRR Lungs: CTA b/l Abd: Soft, ND. Appropriatley tender. Ecchymosis noted around umbilical incision, otherwise incisions c/d/i Msk: No edema  Allergies as of 03/26/2018   No Known Allergies     Medication List    TAKE these medications   amoxicillin-clavulanate 875-125 MG tablet Commonly known as:  AUGMENTIN Take 1 tablet by mouth every 12 (twelve) hours.   cyclobenzaprine 5 MG tablet Commonly known as:  FLEXERIL Take 1 tablet (5 mg total) by mouth 3 (three) times daily as needed for muscle spasms.   docusate sodium 100 MG capsule Commonly known as:  COLACE Take 1 capsule (100 mg total) by mouth daily as needed for mild  constipation.   ezetimibe 10 MG tablet Commonly known as:  ZETIA Take 10 mg by mouth daily.   irbesartan-hydrochlorothiazide 150-12.5 MG tablet Commonly known as:  AVALIDE Take 1 tablet by mouth daily.   levothyroxine 150 MCG tablet Commonly known as:  SYNTHROID, LEVOTHROID Take 150 mcg by mouth daily before breakfast.   oxyCODONE 5 MG immediate release tablet Commonly known as:  Oxy IR/ROXICODONE Take 1 tablet (5 mg total) by mouth every 6 (six) hours as needed for moderate pain.   polyethylene glycol packet Commonly known as:  MIRALAX Take 17 g by mouth daily as needed for mild constipation.   rosuvastatin 20 MG tablet Commonly known as:  CRESTOR Take 20 mg by mouth daily.        Follow-up Information    [facility], [place] Follow up in 2 week(s).   Specialty:  General Surgery Why:  Please call the office later today for your appointment date and time. Please arrive 30 minutes before your appointment time. Please bring a copy of your photo ID and insurance card.  Contact information: [address] [place] [address] [phone]           Signed: Alferd Apa, [facility] 03/26/2018, 7:59 AM Pager: [phone]

## 2018-03-26 NOTE — Progress Notes (Signed)
Patient discharged to home. Verbalizes understanding of all discharge instructions including incision care, discharge medications, and follow up MD visits. Patient accompanied by wife.

## 2018-03-26 NOTE — Discharge Instructions (Signed)
[facility]  Please arrive at least 30 min before your appointment to complete your check in paperwork.  If you are unable to arrive 30 min prior to your appointment time we may have to cancel or reschedule you. LAPAROSCOPIC SURGERY: POST OP INSTRUCTIONS Always review your discharge instruction sheet given to you by the facility where your surgery was performed. IF YOU HAVE DISABILITY OR FAMILY LEAVE FORMS, YOU MUST BRING THEM TO THE OFFICE FOR PROCESSING.   DO NOT GIVE THEM TO YOUR DOCTOR.  PAIN CONTROL  1. First take acetaminophen (Tylenol) AND/or ibuprofen (Advil) to control your pain after surgery.  Follow directions on package.  Taking acetaminophen (Tylenol) and/or ibuprofen (Advil) regularly after surgery will help to control your pain and lower the amount of prescription pain medication you may need.  You should not take more than 4,000 mg (4 grams) of acetaminophen (Tylenol) in 24 hours.  You should not take ibuprofen (Advil), aleve, motrin, naprosyn or other NSAIDS if you have a history of stomach ulcers or chronic kidney disease.  2. A prescription for pain medication may be given to you upon discharge.  Take your pain medication as prescribed, if you still have uncontrolled pain after taking acetaminophen (Tylenol) or ibuprofen (Advil). 3. Use ice packs to help control pain. 4. If you need a refill on your pain medication, please contact your pharmacy.  They will contact our office to request authorization. Prescriptions will not be filled after 5pm or on week-ends.  HOME MEDICATIONS 5. Take your usually prescribed medications unless otherwise directed.  DIET 6. You should follow a light diet the first few days after arrival home.  Be sure to include lots of fluids daily. Avoid fatty, fried foods.   CONSTIPATION 7. It is common to experience some constipation after surgery and if you are taking pain medication.  Increasing fluid intake and taking a stool  softener (such as Colace) will usually help or prevent this problem from occurring.  A mild laxative (Milk of Magnesia or Miralax) should be taken according to package instructions if there are no bowel movements after 48 hours.  WOUND/INCISION CARE 8. Most patients will experience some swelling and bruising in the area of the incisions.  Ice packs will help.  Swelling and bruising can take several days to resolve.  9. Unless discharge instructions indicate otherwise, follow guidelines below  a. STERI-STRIPS - you may remove your outer bandages 48 hours after surgery, and you may shower at that time.  You have steri-strips (small skin tapes) in place directly over the incision.  These strips should be left on the skin for 7-10 days.   b. DERMABOND/SKIN GLUE - you may shower in 24 hours.  The glue will flake off over the next 2-3 weeks. 10. Any sutures or staples will be removed at the office during your follow-up visit.  ACTIVITIES 11. You may resume regular (light) daily activities beginning the next day--such as daily self-care, walking, climbing stairs--gradually increasing activities as tolerated.  You may have sexual intercourse when it is comfortable.  Refrain from any heavy lifting or straining until approved by your doctor. a. You may drive when you are no longer taking prescription pain medication, you can comfortably wear a seatbelt, and you can safely maneuver your car and apply brakes.  FOLLOW-UP 12. You should see your doctor in the office for a follow-up appointment approximately 2-3 weeks after your surgery.  You should have been given your post-op/follow-up appointment when   your surgery was scheduled.  If you did not receive a post-op/follow-up appointment, make sure that you call for this appointment within a day or two after you arrive home to insure a convenient appointment time.   WHEN TO CALL YOUR DOCTOR: 1. Fever over 101.0 2. Inability to urinate 3. Continued bleeding from  incision. 4. Increased pain, redness, or drainage from the incision. 5. Increasing abdominal pain  The clinic staff is available to answer your questions during regular business hours.  Please don't hesitate to call and ask to speak to one of the nurses for clinical concerns.  If you have a medical emergency, go to the nearest emergency room or call 911.  A surgeon from [facility] is always on call at the hospital. [address], [address], [place], [place]  [address] ? [address] [address], [place], [place]   [address] [phone] ? [phone] ? FAX [phone]  .........   Managing Your Pain After Surgery Without Opioids    Thank you for participating in our program to help patients manage their pain after surgery without opioids. This is part of our effort to provide you with the best care possible, without exposing you or your family to the risk that opioids pose.  What pain can I expect after surgery? You can expect to have some pain after surgery. This is normal. The pain is typically worse the day after surgery, and quickly begins to get better. Many studies have found that many patients are able to manage their pain after surgery with Over-the-Counter (OTC) medications such as Tylenol and Motrin. If you have a condition that does not allow you to take Tylenol or Motrin, notify your surgical team.  How will I manage my pain? The best strategy for controlling your pain after surgery is around the clock pain control with Tylenol (acetaminophen) and Motrin (ibuprofen or Advil). Alternating these medications with each other allows you to maximize your pain control. In addition to Tylenol and Motrin, you can use heating pads or ice packs on your incisions to help reduce your pain.  How will I alternate your regular strength over-the-counter pain medication? You will take a dose of pain medication every three hours. ; Start by taking 650 mg of Tylenol (2 pills of 325  mg) ; 3 hours later take 600 mg of Motrin (3 pills of 200 mg) ; 3 hours after taking the Motrin take 650 mg of Tylenol ; 3 hours after that take 600 mg of Motrin.   - 1 -  See example - if your first dose of Tylenol is at 12:00 PM   12:00 PM Tylenol 650 mg (2 pills of 325 mg)  3:00 PM Motrin 600 mg (3 pills of 200 mg)  6:00 PM Tylenol 650 mg (2 pills of 325 mg)  9:00 PM Motrin 600 mg (3 pills of 200 mg)  Continue alternating every 3 hours   We recommend that you follow this schedule around-the-clock for at least 3 days after surgery, or until you feel that it is no longer needed. Use the table on the last page of this handout to keep track of the medications you are taking. Important: Do not take more than 3000mg of Tylenol or 3200mg of Motrin in a 24-hour period. Do not take ibuprofen/Motrin if you have a history of bleeding stomach ulcers, severe kidney disease, &/or actively taking a blood thinner  What if I still have pain? If you have pain that is not   controlled with the over-the-counter pain medications (Tylenol and Motrin or Advil) you might have what we call "breakthrough" pain. You will receive a prescription for a small amount of an opioid pain medication such as Oxycodone, Tramadol, or Tylenol with Codeine. Use these opioid pills in the first 24 hours after surgery if you have breakthrough pain. Do not take more than 1 pill every 4-6 hours.  If you still have uncontrolled pain after using all opioid pills, don't hesitate to call our staff using the number provided. We will help make sure you are managing your pain in the best way possible, and if necessary, we can provide a prescription for additional pain medication.   Day 1    Time  Name of Medication Number of pills taken  Amount of Acetaminophen  Pain Level   Comments  AM PM       AM PM       AM PM       AM PM       AM PM       AM PM       AM PM       AM PM       Total Daily amount of Acetaminophen Do not  take more than  3,000 mg per day      Day 2    Time  Name of Medication Number of pills taken  Amount of Acetaminophen  Pain Level   Comments  AM PM       AM PM       AM PM       AM PM       AM PM       AM PM       AM PM       AM PM       Total Daily amount of Acetaminophen Do not take more than  3,000 mg per day      Day 3    Time  Name of Medication Number of pills taken  Amount of Acetaminophen  Pain Level   Comments  AM PM       AM PM       AM PM       AM PM          AM PM       AM PM       AM PM       AM PM       Total Daily amount of Acetaminophen Do not take more than  3,000 mg per day      Day 4    Time  Name of Medication Number of pills taken  Amount of Acetaminophen  Pain Level   Comments  AM PM       AM PM       AM PM       AM PM       AM PM       AM PM       AM PM       AM PM       Total Daily amount of Acetaminophen Do not take more than  3,000 mg per day      Day 5    Time  Name of Medication Number of pills taken  Amount of Acetaminophen  Pain Level   Comments  AM PM       AM PM       AM   PM       AM PM       AM PM       AM PM       AM PM       AM PM       Total Daily amount of Acetaminophen Do not take more than  3,000 mg per day       Day 6    Time  Name of Medication Number of pills taken  Amount of Acetaminophen  Pain Level  Comments  AM PM       AM PM       AM PM       AM PM       AM PM       AM PM       AM PM       AM PM       Total Daily amount of Acetaminophen Do not take more than  3,000 mg per day      Day 7    Time  Name of Medication Number of pills taken  Amount of Acetaminophen  Pain Level   Comments  AM PM       AM PM       AM PM       AM PM       AM PM       AM PM       AM PM       AM PM       Total Daily amount of Acetaminophen Do not take more than  3,000 mg per day        For additional information about how and where to safely dispose of unused  opioid medications - https://www.morepowerfulnc.org  Disclaimer: This document contains information and/or instructional materials adapted from [facility] for the typical patient with your condition. It does not replace medical advice from your health care provider because your experience may differ from that of the typical patient. Talk to your health care provider if you have any questions about this document, your condition or your treatment plan. Adapted from [facility]   

## 2018-03-28 NOTE — Anesthesia Postprocedure Evaluation (Signed)
Anesthesia Post Note  Patient: DAMIAN BUCKLES  Procedure(s) Performed: APPENDECTOMY LAPAROSCOPIC (N/A Abdomen)     Patient location during evaluation: PACU Anesthesia Type: General Level of consciousness: awake and alert Pain management: pain level controlled Vital Signs Assessment: post-procedure vital signs reviewed and stable Respiratory status: spontaneous breathing, nonlabored ventilation, respiratory function stable and patient connected to nasal cannula oxygen Cardiovascular status: blood pressure returned to baseline and stable Postop Assessment: no apparent nausea or vomiting Anesthetic complications: no    Last Vitals:  Vitals:   03/25/18 2110 03/26/18 0532  BP: 120/70 102/66  Pulse: 64 67  Resp: 18 18  Temp: 36.9 C 36.9 C  SpO2: 96% 95%    Last Pain:  Vitals:   03/26/18 0738  TempSrc:   PainSc: 0-No pain   Pain Goal: Patients Stated Pain Goal: 2 (03/25/18 2110)                 Crawford S

## 2018-09-13 DIAGNOSIS — F17200 Nicotine dependence, unspecified, uncomplicated: Secondary | ICD-10-CM | POA: Diagnosis not present

## 2018-09-13 DIAGNOSIS — Z0000 Encounter for general adult medical examination without abnormal findings: Secondary | ICD-10-CM | POA: Diagnosis not present

## 2018-09-13 DIAGNOSIS — E1169 Type 2 diabetes mellitus with other specified complication: Secondary | ICD-10-CM | POA: Diagnosis not present

## 2018-09-13 DIAGNOSIS — J410 Simple chronic bronchitis: Secondary | ICD-10-CM | POA: Diagnosis not present

## 2018-09-13 DIAGNOSIS — I77811 Abdominal aortic ectasia: Secondary | ICD-10-CM | POA: Diagnosis not present

## 2018-09-13 DIAGNOSIS — H8110 Benign paroxysmal vertigo, unspecified ear: Secondary | ICD-10-CM | POA: Diagnosis not present

## 2018-09-13 DIAGNOSIS — I10 Essential (primary) hypertension: Secondary | ICD-10-CM | POA: Diagnosis not present

## 2018-09-13 DIAGNOSIS — E039 Hypothyroidism, unspecified: Secondary | ICD-10-CM | POA: Diagnosis not present

## 2018-09-13 DIAGNOSIS — N529 Male erectile dysfunction, unspecified: Secondary | ICD-10-CM | POA: Diagnosis not present

## 2018-09-13 DIAGNOSIS — M720 Palmar fascial fibromatosis [Dupuytren]: Secondary | ICD-10-CM | POA: Diagnosis not present

## 2018-09-13 DIAGNOSIS — Z1389 Encounter for screening for other disorder: Secondary | ICD-10-CM | POA: Diagnosis not present

## 2018-09-13 DIAGNOSIS — E7800 Pure hypercholesterolemia, unspecified: Secondary | ICD-10-CM | POA: Diagnosis not present

## 2018-10-03 DIAGNOSIS — I10 Essential (primary) hypertension: Secondary | ICD-10-CM | POA: Diagnosis not present

## 2018-10-03 DIAGNOSIS — E039 Hypothyroidism, unspecified: Secondary | ICD-10-CM | POA: Diagnosis not present

## 2018-10-03 DIAGNOSIS — Z125 Encounter for screening for malignant neoplasm of prostate: Secondary | ICD-10-CM | POA: Diagnosis not present

## 2018-10-03 DIAGNOSIS — E7800 Pure hypercholesterolemia, unspecified: Secondary | ICD-10-CM | POA: Diagnosis not present

## 2019-03-19 DIAGNOSIS — M720 Palmar fascial fibromatosis [Dupuytren]: Secondary | ICD-10-CM | POA: Diagnosis not present

## 2019-03-19 DIAGNOSIS — E7800 Pure hypercholesterolemia, unspecified: Secondary | ICD-10-CM | POA: Diagnosis not present

## 2019-03-19 DIAGNOSIS — N529 Male erectile dysfunction, unspecified: Secondary | ICD-10-CM | POA: Diagnosis not present

## 2019-03-19 DIAGNOSIS — I10 Essential (primary) hypertension: Secondary | ICD-10-CM | POA: Diagnosis not present

## 2019-03-19 DIAGNOSIS — J410 Simple chronic bronchitis: Secondary | ICD-10-CM | POA: Diagnosis not present

## 2019-03-19 DIAGNOSIS — H8110 Benign paroxysmal vertigo, unspecified ear: Secondary | ICD-10-CM | POA: Diagnosis not present

## 2019-03-19 DIAGNOSIS — E1169 Type 2 diabetes mellitus with other specified complication: Secondary | ICD-10-CM | POA: Diagnosis not present

## 2019-03-19 DIAGNOSIS — E039 Hypothyroidism, unspecified: Secondary | ICD-10-CM | POA: Diagnosis not present

## 2019-03-19 DIAGNOSIS — F17201 Nicotine dependence, unspecified, in remission: Secondary | ICD-10-CM | POA: Diagnosis not present

## 2019-03-19 DIAGNOSIS — I77811 Abdominal aortic ectasia: Secondary | ICD-10-CM | POA: Diagnosis not present

## 2019-05-12 DIAGNOSIS — J441 Chronic obstructive pulmonary disease with (acute) exacerbation: Secondary | ICD-10-CM | POA: Diagnosis not present

## 2019-05-12 DIAGNOSIS — I10 Essential (primary) hypertension: Secondary | ICD-10-CM | POA: Diagnosis not present

## 2019-05-12 DIAGNOSIS — E7800 Pure hypercholesterolemia, unspecified: Secondary | ICD-10-CM | POA: Diagnosis not present

## 2019-05-12 DIAGNOSIS — E039 Hypothyroidism, unspecified: Secondary | ICD-10-CM | POA: Diagnosis not present

## 2019-05-12 DIAGNOSIS — J410 Simple chronic bronchitis: Secondary | ICD-10-CM | POA: Diagnosis not present

## 2019-05-12 DIAGNOSIS — E1169 Type 2 diabetes mellitus with other specified complication: Secondary | ICD-10-CM | POA: Diagnosis not present

## 2019-07-30 DIAGNOSIS — H25813 Combined forms of age-related cataract, bilateral: Secondary | ICD-10-CM | POA: Diagnosis not present

## 2019-07-30 DIAGNOSIS — H43813 Vitreous degeneration, bilateral: Secondary | ICD-10-CM | POA: Diagnosis not present

## 2019-07-30 DIAGNOSIS — E119 Type 2 diabetes mellitus without complications: Secondary | ICD-10-CM | POA: Diagnosis not present

## 2019-09-17 DIAGNOSIS — I77811 Abdominal aortic ectasia: Secondary | ICD-10-CM | POA: Diagnosis not present

## 2019-09-17 DIAGNOSIS — H8110 Benign paroxysmal vertigo, unspecified ear: Secondary | ICD-10-CM | POA: Diagnosis not present

## 2019-09-17 DIAGNOSIS — Z0000 Encounter for general adult medical examination without abnormal findings: Secondary | ICD-10-CM | POA: Diagnosis not present

## 2019-09-17 DIAGNOSIS — E1169 Type 2 diabetes mellitus with other specified complication: Secondary | ICD-10-CM | POA: Diagnosis not present

## 2019-09-17 DIAGNOSIS — M720 Palmar fascial fibromatosis [Dupuytren]: Secondary | ICD-10-CM | POA: Diagnosis not present

## 2019-09-17 DIAGNOSIS — N529 Male erectile dysfunction, unspecified: Secondary | ICD-10-CM | POA: Diagnosis not present

## 2019-09-17 DIAGNOSIS — J410 Simple chronic bronchitis: Secondary | ICD-10-CM | POA: Diagnosis not present

## 2019-09-17 DIAGNOSIS — E7800 Pure hypercholesterolemia, unspecified: Secondary | ICD-10-CM | POA: Diagnosis not present

## 2019-09-17 DIAGNOSIS — E039 Hypothyroidism, unspecified: Secondary | ICD-10-CM | POA: Diagnosis not present

## 2019-09-17 DIAGNOSIS — Z125 Encounter for screening for malignant neoplasm of prostate: Secondary | ICD-10-CM | POA: Diagnosis not present

## 2019-09-17 DIAGNOSIS — Z1389 Encounter for screening for other disorder: Secondary | ICD-10-CM | POA: Diagnosis not present

## 2019-09-17 DIAGNOSIS — I10 Essential (primary) hypertension: Secondary | ICD-10-CM | POA: Diagnosis not present

## 2019-10-14 DIAGNOSIS — E039 Hypothyroidism, unspecified: Secondary | ICD-10-CM | POA: Diagnosis not present

## 2019-10-16 DIAGNOSIS — M170 Bilateral primary osteoarthritis of knee: Secondary | ICD-10-CM | POA: Diagnosis not present

## 2019-11-17 DIAGNOSIS — E039 Hypothyroidism, unspecified: Secondary | ICD-10-CM | POA: Diagnosis not present

## 2019-11-17 DIAGNOSIS — J441 Chronic obstructive pulmonary disease with (acute) exacerbation: Secondary | ICD-10-CM | POA: Diagnosis not present

## 2019-11-17 DIAGNOSIS — J410 Simple chronic bronchitis: Secondary | ICD-10-CM | POA: Diagnosis not present

## 2019-11-17 DIAGNOSIS — E7800 Pure hypercholesterolemia, unspecified: Secondary | ICD-10-CM | POA: Diagnosis not present

## 2019-11-17 DIAGNOSIS — I10 Essential (primary) hypertension: Secondary | ICD-10-CM | POA: Diagnosis not present

## 2019-11-17 DIAGNOSIS — E1169 Type 2 diabetes mellitus with other specified complication: Secondary | ICD-10-CM | POA: Diagnosis not present

## 2019-12-23 DIAGNOSIS — Z23 Encounter for immunization: Secondary | ICD-10-CM | POA: Diagnosis not present

## 2019-12-29 DIAGNOSIS — E039 Hypothyroidism, unspecified: Secondary | ICD-10-CM | POA: Diagnosis not present

## 2020-01-08 DIAGNOSIS — M170 Bilateral primary osteoarthritis of knee: Secondary | ICD-10-CM | POA: Diagnosis not present

## 2020-02-16 DIAGNOSIS — J069 Acute upper respiratory infection, unspecified: Secondary | ICD-10-CM | POA: Diagnosis not present

## 2020-02-16 DIAGNOSIS — R059 Cough, unspecified: Secondary | ICD-10-CM | POA: Diagnosis not present

## 2020-02-16 DIAGNOSIS — R0989 Other specified symptoms and signs involving the circulatory and respiratory systems: Secondary | ICD-10-CM | POA: Diagnosis not present

## 2020-02-16 DIAGNOSIS — J441 Chronic obstructive pulmonary disease with (acute) exacerbation: Secondary | ICD-10-CM | POA: Diagnosis not present

## 2020-03-31 ENCOUNTER — Other Ambulatory Visit: Payer: Self-pay | Admitting: Family Medicine

## 2020-03-31 DIAGNOSIS — E1169 Type 2 diabetes mellitus with other specified complication: Secondary | ICD-10-CM | POA: Diagnosis not present

## 2020-03-31 DIAGNOSIS — E039 Hypothyroidism, unspecified: Secondary | ICD-10-CM | POA: Diagnosis not present

## 2020-03-31 DIAGNOSIS — M720 Palmar fascial fibromatosis [Dupuytren]: Secondary | ICD-10-CM | POA: Diagnosis not present

## 2020-03-31 DIAGNOSIS — H8110 Benign paroxysmal vertigo, unspecified ear: Secondary | ICD-10-CM | POA: Diagnosis not present

## 2020-03-31 DIAGNOSIS — F17201 Nicotine dependence, unspecified, in remission: Secondary | ICD-10-CM | POA: Diagnosis not present

## 2020-03-31 DIAGNOSIS — I77811 Abdominal aortic ectasia: Secondary | ICD-10-CM

## 2020-03-31 DIAGNOSIS — Z6827 Body mass index (BMI) 27.0-27.9, adult: Secondary | ICD-10-CM | POA: Diagnosis not present

## 2020-03-31 DIAGNOSIS — I10 Essential (primary) hypertension: Secondary | ICD-10-CM | POA: Diagnosis not present

## 2020-03-31 DIAGNOSIS — N529 Male erectile dysfunction, unspecified: Secondary | ICD-10-CM | POA: Diagnosis not present

## 2020-03-31 DIAGNOSIS — E7800 Pure hypercholesterolemia, unspecified: Secondary | ICD-10-CM | POA: Diagnosis not present

## 2020-03-31 DIAGNOSIS — J410 Simple chronic bronchitis: Secondary | ICD-10-CM | POA: Diagnosis not present

## 2020-05-03 DIAGNOSIS — J441 Chronic obstructive pulmonary disease with (acute) exacerbation: Secondary | ICD-10-CM | POA: Diagnosis not present

## 2020-05-03 DIAGNOSIS — E7800 Pure hypercholesterolemia, unspecified: Secondary | ICD-10-CM | POA: Diagnosis not present

## 2020-05-03 DIAGNOSIS — I10 Essential (primary) hypertension: Secondary | ICD-10-CM | POA: Diagnosis not present

## 2020-05-03 DIAGNOSIS — E1169 Type 2 diabetes mellitus with other specified complication: Secondary | ICD-10-CM | POA: Diagnosis not present

## 2020-05-03 DIAGNOSIS — E039 Hypothyroidism, unspecified: Secondary | ICD-10-CM | POA: Diagnosis not present

## 2020-05-03 DIAGNOSIS — J410 Simple chronic bronchitis: Secondary | ICD-10-CM | POA: Diagnosis not present

## 2020-06-28 ENCOUNTER — Ambulatory Visit
Admission: RE | Admit: 2020-06-28 | Discharge: 2020-06-28 | Disposition: A | Discharge status: Home / Self Care | Payer: Medicare Other | Source: Ambulatory Visit | Attending: Family Medicine | Admitting: Family Medicine

## 2020-06-28 DIAGNOSIS — I77811 Abdominal aortic ectasia: Secondary | ICD-10-CM | POA: Diagnosis not present

## 2020-08-11 DIAGNOSIS — H02831 Dermatochalasis of right upper eyelid: Secondary | ICD-10-CM | POA: Diagnosis not present

## 2020-08-11 DIAGNOSIS — H25813 Combined forms of age-related cataract, bilateral: Secondary | ICD-10-CM | POA: Diagnosis not present

## 2020-08-11 DIAGNOSIS — E119 Type 2 diabetes mellitus without complications: Secondary | ICD-10-CM | POA: Diagnosis not present

## 2020-08-11 DIAGNOSIS — H43813 Vitreous degeneration, bilateral: Secondary | ICD-10-CM | POA: Diagnosis not present

## 2020-08-11 DIAGNOSIS — H02834 Dermatochalasis of left upper eyelid: Secondary | ICD-10-CM | POA: Diagnosis not present

## 2020-09-28 DIAGNOSIS — N529 Male erectile dysfunction, unspecified: Secondary | ICD-10-CM | POA: Diagnosis not present

## 2020-09-28 DIAGNOSIS — E039 Hypothyroidism, unspecified: Secondary | ICD-10-CM | POA: Diagnosis not present

## 2020-09-28 DIAGNOSIS — I77811 Abdominal aortic ectasia: Secondary | ICD-10-CM | POA: Diagnosis not present

## 2020-09-28 DIAGNOSIS — I700 Atherosclerosis of aorta: Secondary | ICD-10-CM | POA: Diagnosis not present

## 2020-09-28 DIAGNOSIS — E1169 Type 2 diabetes mellitus with other specified complication: Secondary | ICD-10-CM | POA: Diagnosis not present

## 2020-09-28 DIAGNOSIS — Z1389 Encounter for screening for other disorder: Secondary | ICD-10-CM | POA: Diagnosis not present

## 2020-09-28 DIAGNOSIS — I10 Essential (primary) hypertension: Secondary | ICD-10-CM | POA: Diagnosis not present

## 2020-09-28 DIAGNOSIS — H8110 Benign paroxysmal vertigo, unspecified ear: Secondary | ICD-10-CM | POA: Diagnosis not present

## 2020-09-28 DIAGNOSIS — E7800 Pure hypercholesterolemia, unspecified: Secondary | ICD-10-CM | POA: Diagnosis not present

## 2020-09-28 DIAGNOSIS — J410 Simple chronic bronchitis: Secondary | ICD-10-CM | POA: Diagnosis not present

## 2020-09-28 DIAGNOSIS — M720 Palmar fascial fibromatosis [Dupuytren]: Secondary | ICD-10-CM | POA: Diagnosis not present

## 2020-09-28 DIAGNOSIS — Z0000 Encounter for general adult medical examination without abnormal findings: Secondary | ICD-10-CM | POA: Diagnosis not present

## 2021-04-05 DIAGNOSIS — F17201 Nicotine dependence, unspecified, in remission: Secondary | ICD-10-CM | POA: Diagnosis not present

## 2021-04-05 DIAGNOSIS — M720 Palmar fascial fibromatosis [Dupuytren]: Secondary | ICD-10-CM | POA: Diagnosis not present

## 2021-04-05 DIAGNOSIS — J410 Simple chronic bronchitis: Secondary | ICD-10-CM | POA: Diagnosis not present

## 2021-04-05 DIAGNOSIS — H8110 Benign paroxysmal vertigo, unspecified ear: Secondary | ICD-10-CM | POA: Diagnosis not present

## 2021-04-05 DIAGNOSIS — I700 Atherosclerosis of aorta: Secondary | ICD-10-CM | POA: Diagnosis not present

## 2021-04-05 DIAGNOSIS — Z125 Encounter for screening for malignant neoplasm of prostate: Secondary | ICD-10-CM | POA: Diagnosis not present

## 2021-04-05 DIAGNOSIS — I10 Essential (primary) hypertension: Secondary | ICD-10-CM | POA: Diagnosis not present

## 2021-04-05 DIAGNOSIS — E7800 Pure hypercholesterolemia, unspecified: Secondary | ICD-10-CM | POA: Diagnosis not present

## 2021-04-05 DIAGNOSIS — I77811 Abdominal aortic ectasia: Secondary | ICD-10-CM | POA: Diagnosis not present

## 2021-04-05 DIAGNOSIS — E039 Hypothyroidism, unspecified: Secondary | ICD-10-CM | POA: Diagnosis not present

## 2021-04-05 DIAGNOSIS — N529 Male erectile dysfunction, unspecified: Secondary | ICD-10-CM | POA: Diagnosis not present

## 2021-04-05 DIAGNOSIS — E1169 Type 2 diabetes mellitus with other specified complication: Secondary | ICD-10-CM | POA: Diagnosis not present

## 2021-07-19 DIAGNOSIS — Z86010 Personal history of colonic polyps: Secondary | ICD-10-CM | POA: Diagnosis not present

## 2021-07-19 DIAGNOSIS — K5730 Diverticulosis of large intestine without perforation or abscess without bleeding: Secondary | ICD-10-CM | POA: Diagnosis not present

## 2021-07-19 DIAGNOSIS — K649 Unspecified hemorrhoids: Secondary | ICD-10-CM | POA: Diagnosis not present

## 2021-07-19 DIAGNOSIS — D123 Benign neoplasm of transverse colon: Secondary | ICD-10-CM | POA: Diagnosis not present

## 2021-07-19 DIAGNOSIS — D122 Benign neoplasm of ascending colon: Secondary | ICD-10-CM | POA: Diagnosis not present

## 2021-07-19 DIAGNOSIS — D124 Benign neoplasm of descending colon: Secondary | ICD-10-CM | POA: Diagnosis not present

## 2021-07-26 DIAGNOSIS — D122 Benign neoplasm of ascending colon: Secondary | ICD-10-CM | POA: Diagnosis not present

## 2021-07-26 DIAGNOSIS — D123 Benign neoplasm of transverse colon: Secondary | ICD-10-CM | POA: Diagnosis not present

## 2021-07-26 DIAGNOSIS — D124 Benign neoplasm of descending colon: Secondary | ICD-10-CM | POA: Diagnosis not present

## 2021-08-16 DIAGNOSIS — H02834 Dermatochalasis of left upper eyelid: Secondary | ICD-10-CM | POA: Diagnosis not present

## 2021-08-16 DIAGNOSIS — H02831 Dermatochalasis of right upper eyelid: Secondary | ICD-10-CM | POA: Diagnosis not present

## 2021-08-16 DIAGNOSIS — H25813 Combined forms of age-related cataract, bilateral: Secondary | ICD-10-CM | POA: Diagnosis not present

## 2021-08-16 DIAGNOSIS — H43813 Vitreous degeneration, bilateral: Secondary | ICD-10-CM | POA: Diagnosis not present

## 2021-08-16 DIAGNOSIS — E119 Type 2 diabetes mellitus without complications: Secondary | ICD-10-CM | POA: Diagnosis not present

## 2021-11-01 ENCOUNTER — Other Ambulatory Visit: Payer: Self-pay | Admitting: Family Medicine

## 2021-11-01 DIAGNOSIS — Z23 Encounter for immunization: Secondary | ICD-10-CM | POA: Diagnosis not present

## 2021-11-01 DIAGNOSIS — E1169 Type 2 diabetes mellitus with other specified complication: Secondary | ICD-10-CM | POA: Diagnosis not present

## 2021-11-01 DIAGNOSIS — J410 Simple chronic bronchitis: Secondary | ICD-10-CM | POA: Diagnosis not present

## 2021-11-01 DIAGNOSIS — E7800 Pure hypercholesterolemia, unspecified: Secondary | ICD-10-CM | POA: Diagnosis not present

## 2021-11-01 DIAGNOSIS — H8110 Benign paroxysmal vertigo, unspecified ear: Secondary | ICD-10-CM | POA: Diagnosis not present

## 2021-11-01 DIAGNOSIS — I10 Essential (primary) hypertension: Secondary | ICD-10-CM | POA: Diagnosis not present

## 2021-11-01 DIAGNOSIS — Z125 Encounter for screening for malignant neoplasm of prostate: Secondary | ICD-10-CM | POA: Diagnosis not present

## 2021-11-01 DIAGNOSIS — Z1331 Encounter for screening for depression: Secondary | ICD-10-CM | POA: Diagnosis not present

## 2021-11-01 DIAGNOSIS — R0989 Other specified symptoms and signs involving the circulatory and respiratory systems: Secondary | ICD-10-CM

## 2021-11-01 DIAGNOSIS — N529 Male erectile dysfunction, unspecified: Secondary | ICD-10-CM | POA: Diagnosis not present

## 2021-11-01 DIAGNOSIS — I77811 Abdominal aortic ectasia: Secondary | ICD-10-CM | POA: Diagnosis not present

## 2021-11-01 DIAGNOSIS — E039 Hypothyroidism, unspecified: Secondary | ICD-10-CM | POA: Diagnosis not present

## 2021-11-01 DIAGNOSIS — I700 Atherosclerosis of aorta: Secondary | ICD-10-CM | POA: Diagnosis not present

## 2021-11-01 DIAGNOSIS — Z0000 Encounter for general adult medical examination without abnormal findings: Secondary | ICD-10-CM | POA: Diagnosis not present

## 2021-11-08 ENCOUNTER — Ambulatory Visit
Admission: RE | Admit: 2021-11-08 | Discharge: 2021-11-08 | Disposition: A | Discharge status: Home / Self Care | Payer: Medicare Other | Source: Ambulatory Visit | Attending: Family Medicine | Admitting: Family Medicine

## 2021-11-08 DIAGNOSIS — I10 Essential (primary) hypertension: Secondary | ICD-10-CM | POA: Diagnosis not present

## 2021-11-08 DIAGNOSIS — R0989 Other specified symptoms and signs involving the circulatory and respiratory systems: Secondary | ICD-10-CM

## 2021-11-08 DIAGNOSIS — I6523 Occlusion and stenosis of bilateral carotid arteries: Secondary | ICD-10-CM | POA: Diagnosis not present

## 2021-12-07 ENCOUNTER — Other Ambulatory Visit: Payer: Self-pay | Admitting: *Deleted

## 2021-12-07 DIAGNOSIS — Z122 Encounter for screening for malignant neoplasm of respiratory organs: Secondary | ICD-10-CM

## 2021-12-07 DIAGNOSIS — Z87891 Personal history of nicotine dependence: Secondary | ICD-10-CM

## 2021-12-14 ENCOUNTER — Encounter: Payer: Self-pay | Admitting: Vascular Surgery

## 2021-12-14 ENCOUNTER — Ambulatory Visit (INDEPENDENT_AMBULATORY_CARE_PROVIDER_SITE_OTHER): Payer: Medicare Other | Admitting: Vascular Surgery

## 2021-12-14 VITALS — BP 107/67 | HR 83 | Temp 98.3°F | Resp 20 | Ht 68.0 in | Wt 157.0 lb

## 2021-12-14 DIAGNOSIS — I6523 Occlusion and stenosis of bilateral carotid arteries: Secondary | ICD-10-CM | POA: Diagnosis not present

## 2021-12-14 DIAGNOSIS — I771 Stricture of artery: Secondary | ICD-10-CM | POA: Diagnosis not present

## 2021-12-14 NOTE — Progress Notes (Signed)
Patient ID: ELJAY LAVE, male   DOB: November 06, 1950, 71 y.o.   MRN: [id number]  Reason for Consult: New Patient (Initial Visit)   Referred by Maury Dus, MD  Subjective:     HPI:  WAYDE GOPAUL is a 71 y.o. male originally from [place].  He has had discrepancy in his upper extremity blood pressures was found to have stenosis of the right subclavian artery.  He does have pain in his right shoulder with lifting above his head but no other right upper extremity symptoms and denies any issues with lightheadedness when using his right arm.  He is right-hand dominant.  He does take aspirin and a statin.  He is a former smoker quit many years ago.  Past Medical History:  Diagnosis Date   Hypertension    Pre-diabetes    Thyroid condition    History reviewed. No pertinent family history. Past Surgical History:  Procedure Laterality Date   APPENDECTOMY  03/24/2018   LAPAROSCOPIC APPENDECTOMY N/A 03/24/2018   Procedure: APPENDECTOMY LAPAROSCOPIC;  Surgeon: Georganna Skeans, MD;  Location: [facility];  Service: General;  Laterality: N/A;   MOUTH SURGERY     SHOULDER SURGERY      Short Social History:  Social History   Tobacco Use   Smoking status: Former    Packs/day: 0.50    Years: 44.00    Total pack years: 22.00    Types: Cigarettes    Quit date: 05/2020    Years since quitting: 1.5   Smokeless tobacco: Never  Substance Use Topics   Alcohol use: Never    No Known Allergies  Current Outpatient Medications  Medication Sig Dispense Refill   ezetimibe (ZETIA) 10 MG tablet Take 10 mg by mouth daily.     irbesartan-hydrochlorothiazide (AVALIDE) 150-12.5 MG tablet Take 1 tablet by mouth daily.     metFORMIN (GLUCOPHAGE-XR) 500 MG 24 hr tablet Take 500 mg by mouth 2 (two) times daily.     rosuvastatin (CRESTOR) 20 MG tablet Take 20 mg by mouth daily.     SYNTHROID 125 MCG tablet Take 125 mcg by mouth daily.     No current facility-administered medications for this visit.     Review of Systems  Constitutional:  Constitutional negative. HENT: HENT negative.  Eyes: Eyes negative.  Respiratory: Respiratory negative.  Cardiovascular: Cardiovascular negative.  GI: Gastrointestinal negative.  Musculoskeletal:       Right shoulder pain Skin: Skin negative.  Neurological: Neurological negative. Hematologic: Hematologic/lymphatic negative.  Psychiatric: Psychiatric negative.        Objective:  Objective   Vitals:   12/14/21 1052 12/14/21 1056  BP: (!) 144/66 107/67  Pulse: 83   Resp: 20   Temp: 98.3 F (36.8 C)   SpO2: 95%      Physical Exam HENT:     Head: Normocephalic.     Nose: Nose normal.  Eyes:     Pupils: Pupils are equal, round, and reactive to light.  Cardiovascular:     Rate and Rhythm: Normal rate.     Pulses:          Radial pulses are 1+ on the right side and 2+ on the left side.     Heart sounds: Normal heart sounds.  Pulmonary:     Effort: No respiratory distress.  Abdominal:     General: Abdomen is flat.     Palpations: Abdomen is soft.  Musculoskeletal:     Right lower leg: No edema.  Left lower leg: No edema.  Skin:    General: Skin is warm and dry.     Capillary Refill: Capillary refill takes less than 2 seconds.  Neurological:     General: No focal deficit present.     Mental Status: He is alert.  Psychiatric:        Mood and Affect: Mood normal.     Data: RIGHT   ICA: 84/26 cm/sec   CCA: 99/24 cm/sec   SYSTOLIC ICA/CCA RATIO:  0.8   ECA: 160 cm/sec   LEFT   ICA: 91/30 cm/sec   CCA: 268/34 cm/sec   SYSTOLIC ICA/CCA RATIO:  0.4   ECA: 93 cm/sec   RIGHT CAROTID ARTERY: Moderate calcified shadowing plaque of the right proximal internal carotid artery.   RIGHT VERTEBRAL ARTERY: Bidirectional flow noted in the right vertebral artery.   LEFT CAROTID ARTERY: Mild to moderate calcified plaque of the carotid bifurcation.   LEFT VERTEBRAL ARTERY:  Antegrade flow.   IMPRESSION: 1. Right  greater than left moderate calcified plaque of the carotid bifurcations with Doppler measurements indicative of less than 50% stenosis. 2. Bidirectional flow in the right vertebral artery is suspicious for pre steal syndrome with right brachiocephalic/subclavian vein stenosis. Further evaluation with CT angiography of the neck should be considered.     Assessment/Plan:    71 year old male with asymptomatic right subclavian artery stenosis.  Also stenosis of the bilateral carotid arteries.  We will repeat carotid duplex in 1 year.  Patient is to take blood pressure in the left upper extremity but no intervention needed for subclavian artery stenosis given that he is asymptomatic.     Waynetta Sandy MD [facility]

## 2022-01-04 ENCOUNTER — Encounter: Payer: Self-pay | Admitting: Acute Care

## 2022-01-04 ENCOUNTER — Ambulatory Visit (INDEPENDENT_AMBULATORY_CARE_PROVIDER_SITE_OTHER): Payer: Medicare Other | Admitting: Acute Care

## 2022-01-04 DIAGNOSIS — Z87891 Personal history of nicotine dependence: Secondary | ICD-10-CM | POA: Diagnosis not present

## 2022-01-04 NOTE — Patient Instructions (Signed)
Thank you for participating in the [facility] Screening Program. It was our pleasure to meet you today. We will call you with the results of your scan within the next few days. Your scan will be assigned a Lung RADS category score by the physicians reading the scans.  This Lung RADS score determines follow up scanning.  See below for description of categories, and follow up screening recommendations. We will be in touch to schedule your follow up screening annually or based on recommendations of our providers. We will fax a copy of your scan results to your Primary Care Physician, or the physician who referred you to the program, to ensure they have the results. Please call the office if you have any questions or concerns regarding your scanning experience or results.  Our office number is [phone]. Please speak with Denise Phelps, RN. , or  Denise Buckner RN, They are  our Lung Cancer Screening RN.'s If They are unavailable when you call, Please leave a message on the voice mail. We will return your call at our earliest convenience.This voice mail is monitored several times a day.  Remember, if your scan is normal, we will scan you annually as long as you continue to meet the criteria for the program. (Age 55-77, Current smoker or smoker who has quit within the last 15 years). If you are a smoker, remember, quitting is the single most powerful action that you can take to decrease your risk of lung cancer and other pulmonary, breathing related problems. We know quitting is hard, and we are here to help.  Please let us know if there is anything we can do to help you meet your goal of quitting. If you are a former smoker, congratulations. We are proud of you! Remain smoke free! Remember you can refer friends or family members through the number above.  We will screen them to make sure they meet criteria for the program. Thank you for helping us take better care of you by  participating in Lung Screening.  You can receive free nicotine replacement therapy ( patches, gum or mints) by calling 1-800-QUIT NOW. Please call so we can get you on the path to becoming  a non-smoker. I know it is hard, but you can do this!  Lung RADS Categories:  Lung RADS 1: no nodules or definitely non-concerning nodules.  Recommendation is for a repeat annual scan in 12 months.  Lung RADS 2:  nodules that are non-concerning in appearance and behavior with a very low likelihood of becoming an active cancer. Recommendation is for a repeat annual scan in 12 months.  Lung RADS 3: nodules that are probably non-concerning , includes nodules with a low likelihood of becoming an active cancer.  Recommendation is for a 6-month repeat screening scan. Often noted after an upper respiratory illness. We will be in touch to make sure you have no questions, and to schedule your 6-month scan.  Lung RADS 4 A: nodules with concerning findings, recommendation is most often for a follow up scan in 3 months or additional testing based on our provider's assessment of the scan. We will be in touch to make sure you have no questions and to schedule the recommended 3 month follow up scan.  Lung RADS 4 B:  indicates findings that are concerning. We will be in touch with you to schedule additional diagnostic testing based on our provider's  assessment of the scan.  Other options for assistance in smoking cessation (   As covered by your insurance benefits)  Hypnosis for smoking cessation  [employer] [phone]  Acupuncture for smoking cessation  [facility] [phone]   

## 2022-01-04 NOTE — Progress Notes (Signed)
Virtual Visit via Telephone Note  I connected with Alfred Gomez on 01/04/22 at  8:30 AM EST by telephone and verified that I am speaking with the correct person using two identifiers.  Location: Patient:  At home Provider:  [address], [place], [place], [address]    I discussed the limitations, risks, security and privacy concerns of performing an evaluation and management service by telephone and the availability of in person appointments. I also discussed with the patient that there may be a patient responsible charge related to this service. The patient expressed understanding and agreed to proceed.     Shared Decision Making Visit Lung Cancer Screening Program [phone])   Eligibility: Age 71 y.o. Pack Years Smoking History Calculation 112 pack year smoking history (# packs/per year x # years smoked) Recent History of coughing up blood  no Unexplained weight loss? no ( >Than 15 pounds within the last 6 months ) Prior History Lung / other cancer no (Diagnosis within the last 5 years already requiring surveillance chest CT Scans). Smoking Status Former Smoker Former Smokers: Years since quit: 1 year  Quit Date: 2012  Visit Components: Discussion included one or more decision making aids. yes Discussion included risk/benefits of screening. yes Discussion included potential follow up diagnostic testing for abnormal scans. yes Discussion included meaning and risk of over diagnosis. yes Discussion included meaning and risk of False Positives. yes Discussion included meaning of total radiation exposure. yes  Counseling Included: Importance of adherence to annual lung cancer LDCT screening. yes Impact of comorbidities on ability to participate in the program. yes Ability and willingness to under diagnostic treatment. yes  Smoking Cessation Counseling: Current Smokers:  Discussed importance of smoking cessation. yes Information about tobacco cessation classes and  interventions provided to patient. yes Patient provided with "ticket" for LDCT Scan. yes Symptomatic Patient. no  Counseling NA Diagnosis Code: Tobacco Use Z72.0 Asymptomatic Patient yes  Counseling (Intermediate counseling: > three minutes counseling) [id number] Former Smokers:  Discussed the importance of maintaining cigarette abstinence. yes Diagnosis Code: Personal History of Nicotine Dependence. H67.591 Information about tobacco cessation classes and interventions provided to patient. Yes Patient provided with "ticket" for LDCT Scan. yes Written Order for Lung Cancer Screening with LDCT placed in Epic. Yes (CT Chest Lung Cancer Screening Low Dose W/O CM) [id number] Z12.2-Screening of respiratory organs Z87.891-Personal history of nicotine dependence  I spent 25 minutes of face to face time/virtual visit time  with  Alfred Gomez discussing the risks and benefits of lung cancer screening. We took the time to pause the power point at intervals to allow for questions to be asked and answered to ensure understanding. We discussed that he had taken the single most powerful action possible to decrease his risk of developing lung cancer when he quit smoking. I counseled him to remain smoke free, and to contact me if he ever had the desire to smoke again so that I can provide resources and tools to help support the effort to remain smoke free. We discussed the time and location of the scan, and that either  Alfred Glassman RN, Alfred Prince, RN or I  or I will call / send a letter with the results within  24-72 hours of receiving them. He has the office contact information in the event he needs to speak with me,  he verbalized understanding of all of the above and had no further questions upon leaving the office.     I explained to the patient  that there has been a high incidence of coronary artery disease noted on these exams. I explained that this is a non-gated exam therefore degree or severity cannot be  determined. This patient is on statin therapy. I have asked the patient to follow-up with their PCP regarding any incidental finding of coronary artery disease and management with diet or medication as they feel is clinically indicated. The patient verbalized understanding of the above and had no further questions.     Magdalen Spatz, NP 01/04/2022

## 2022-01-05 ENCOUNTER — Ambulatory Visit
Admission: RE | Admit: 2022-01-05 | Discharge: 2022-01-05 | Disposition: A | Discharge status: Home / Self Care | Payer: Medicare Other | Source: Ambulatory Visit | Attending: Acute Care | Admitting: Acute Care

## 2022-01-05 DIAGNOSIS — Z87891 Personal history of nicotine dependence: Secondary | ICD-10-CM | POA: Diagnosis not present

## 2022-01-05 DIAGNOSIS — Z122 Encounter for screening for malignant neoplasm of respiratory organs: Secondary | ICD-10-CM

## 2022-01-05 DIAGNOSIS — J432 Centrilobular emphysema: Secondary | ICD-10-CM | POA: Diagnosis not present

## 2022-01-05 DIAGNOSIS — I700 Atherosclerosis of aorta: Secondary | ICD-10-CM | POA: Diagnosis not present

## 2022-01-05 DIAGNOSIS — I2510 Atherosclerotic heart disease of native coronary artery without angina pectoris: Secondary | ICD-10-CM | POA: Diagnosis not present

## 2022-01-09 ENCOUNTER — Other Ambulatory Visit: Payer: Self-pay

## 2022-01-09 DIAGNOSIS — Z122 Encounter for screening for malignant neoplasm of respiratory organs: Secondary | ICD-10-CM

## 2022-01-09 DIAGNOSIS — Z87891 Personal history of nicotine dependence: Secondary | ICD-10-CM

## 2022-05-02 DIAGNOSIS — N529 Male erectile dysfunction, unspecified: Secondary | ICD-10-CM | POA: Diagnosis not present

## 2022-05-02 DIAGNOSIS — E039 Hypothyroidism, unspecified: Secondary | ICD-10-CM | POA: Diagnosis not present

## 2022-05-02 DIAGNOSIS — E7800 Pure hypercholesterolemia, unspecified: Secondary | ICD-10-CM | POA: Diagnosis not present

## 2022-05-02 DIAGNOSIS — E1169 Type 2 diabetes mellitus with other specified complication: Secondary | ICD-10-CM | POA: Diagnosis not present

## 2022-05-02 DIAGNOSIS — I700 Atherosclerosis of aorta: Secondary | ICD-10-CM | POA: Diagnosis not present

## 2022-05-02 DIAGNOSIS — I6521 Occlusion and stenosis of right carotid artery: Secondary | ICD-10-CM | POA: Diagnosis not present

## 2022-05-02 DIAGNOSIS — I10 Essential (primary) hypertension: Secondary | ICD-10-CM | POA: Diagnosis not present

## 2022-05-02 DIAGNOSIS — I77811 Abdominal aortic ectasia: Secondary | ICD-10-CM | POA: Diagnosis not present

## 2022-08-22 DIAGNOSIS — H02834 Dermatochalasis of left upper eyelid: Secondary | ICD-10-CM | POA: Diagnosis not present

## 2022-08-22 DIAGNOSIS — H02831 Dermatochalasis of right upper eyelid: Secondary | ICD-10-CM | POA: Diagnosis not present

## 2022-08-22 DIAGNOSIS — H43813 Vitreous degeneration, bilateral: Secondary | ICD-10-CM | POA: Diagnosis not present

## 2022-08-22 DIAGNOSIS — H25813 Combined forms of age-related cataract, bilateral: Secondary | ICD-10-CM | POA: Diagnosis not present

## 2022-08-22 DIAGNOSIS — E119 Type 2 diabetes mellitus without complications: Secondary | ICD-10-CM | POA: Diagnosis not present

## 2022-12-15 DIAGNOSIS — I700 Atherosclerosis of aorta: Secondary | ICD-10-CM | POA: Diagnosis not present

## 2022-12-15 DIAGNOSIS — I10 Essential (primary) hypertension: Secondary | ICD-10-CM | POA: Diagnosis not present

## 2022-12-15 DIAGNOSIS — E039 Hypothyroidism, unspecified: Secondary | ICD-10-CM | POA: Diagnosis not present

## 2022-12-15 DIAGNOSIS — E1169 Type 2 diabetes mellitus with other specified complication: Secondary | ICD-10-CM | POA: Diagnosis not present

## 2022-12-15 DIAGNOSIS — N529 Male erectile dysfunction, unspecified: Secondary | ICD-10-CM | POA: Diagnosis not present

## 2022-12-15 DIAGNOSIS — Z1331 Encounter for screening for depression: Secondary | ICD-10-CM | POA: Diagnosis not present

## 2022-12-15 DIAGNOSIS — I77811 Abdominal aortic ectasia: Secondary | ICD-10-CM | POA: Diagnosis not present

## 2022-12-15 DIAGNOSIS — E7800 Pure hypercholesterolemia, unspecified: Secondary | ICD-10-CM | POA: Diagnosis not present

## 2022-12-15 DIAGNOSIS — Z23 Encounter for immunization: Secondary | ICD-10-CM | POA: Diagnosis not present

## 2022-12-15 DIAGNOSIS — I6521 Occlusion and stenosis of right carotid artery: Secondary | ICD-10-CM | POA: Diagnosis not present

## 2022-12-15 DIAGNOSIS — Z0000 Encounter for general adult medical examination without abnormal findings: Secondary | ICD-10-CM | POA: Diagnosis not present

## 2022-12-15 DIAGNOSIS — Z125 Encounter for screening for malignant neoplasm of prostate: Secondary | ICD-10-CM | POA: Diagnosis not present

## 2022-12-19 ENCOUNTER — Other Ambulatory Visit: Payer: Self-pay | Admitting: *Deleted

## 2022-12-19 DIAGNOSIS — I6523 Occlusion and stenosis of bilateral carotid arteries: Secondary | ICD-10-CM

## 2022-12-19 DIAGNOSIS — I771 Stricture of artery: Secondary | ICD-10-CM

## 2022-12-27 ENCOUNTER — Ambulatory Visit (HOSPITAL_COMMUNITY)
Admission: RE | Admit: 2022-12-27 | Discharge: 2022-12-27 | Disposition: A | Discharge status: Home / Self Care | Payer: Medicare Other | Source: Ambulatory Visit | Attending: Vascular Surgery | Admitting: Vascular Surgery

## 2022-12-27 ENCOUNTER — Ambulatory Visit (INDEPENDENT_AMBULATORY_CARE_PROVIDER_SITE_OTHER): Payer: Medicare Other | Admitting: Physician Assistant

## 2022-12-27 VITALS — BP 125/66 | HR 79 | Temp 98.0°F | Ht 66.0 in | Wt 158.9 lb

## 2022-12-27 DIAGNOSIS — I771 Stricture of artery: Secondary | ICD-10-CM | POA: Diagnosis not present

## 2022-12-27 DIAGNOSIS — I6523 Occlusion and stenosis of bilateral carotid arteries: Secondary | ICD-10-CM

## 2022-12-27 NOTE — Progress Notes (Signed)
Office Note     CC:  follow up Requesting Provider:  Tally Joe, MD  HPI: Alfred Gomez is a 72 y.o. (02-Dec-1950) male who presents for surveillance of right subclavian artery stenosis as well as carotid artery stenosis.  He was initially referred to our office due to discrepancy in blood pressures from right to left arm.  He denies any drop attacks or right arm claudication.  He will occasionally have some pain and weakness in his right arm if he is washing himself in the shower however the symptoms are tolerable and do not impact his day-to-day life.  He also denies any diagnosis of CVA or TIA since last office visit.  He has not had any neurological events including slurring speech, changes in vision, or one-sided weakness.  He is a former smoker.  He is taking a daily statin.  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.   Past Medical History:  Diagnosis Date   Hypertension    Pre-diabetes    Thyroid condition     Past Surgical History:  Procedure Laterality Date   APPENDECTOMY  03/24/2018   LAPAROSCOPIC APPENDECTOMY N/A 03/24/2018   Procedure: APPENDECTOMY LAPAROSCOPIC;  Surgeon: Violeta Gelinas, MD;  Location: [facility] OR;  Service: General;  Laterality: N/A;   MOUTH SURGERY     SHOULDER SURGERY      Social History   Socioeconomic History   Marital status: Married    Spouse name: Not on file   Number of children: Not on file   Years of education: Not on file   Highest education level: Not on file  Occupational History   Not on file  Tobacco Use   Smoking status: Former    Current packs/day: 0.00    Types: Cigarettes    Quit date: 05/2020    Years since quitting: 2.6   Smokeless tobacco: Never  Vaping Use   Vaping status: Never Used  Substance and Sexual Activity   Alcohol use: Never   Drug use: Never   Sexual activity: Not on file  Other Topics Concern   Not on file  Social History Narrative   Not on file   Social Determinants of Health    Financial Resource Strain: Not on file  Food Insecurity: Not on file  Transportation Needs: Not on file  Physical Activity: Not on file  Stress: Not on file  Social Connections: Not on file  Intimate Partner Violence: Not on file   History reviewed. No pertinent family history.  Current Outpatient Medications  Medication Sig Dispense Refill   ezetimibe (ZETIA) 10 MG tablet Take 10 mg by mouth daily.     irbesartan-hydrochlorothiazide (AVALIDE) 150-12.5 MG tablet Take 1 tablet by mouth daily.     metFORMIN (GLUCOPHAGE-XR) 500 MG 24 hr tablet Take 500 mg by mouth 2 (two) times daily.     rosuvastatin (CRESTOR) 20 MG tablet Take 20 mg by mouth daily.     SYNTHROID 125 MCG tablet Take 137 mcg by mouth daily.     No current facility-administered medications for this visit.    No Known Allergies   REVIEW OF SYSTEMS:   [X]  denotes positive finding, [ ]  denotes negative finding Cardiac  Comments:  Chest pain or chest pressure:    Shortness of breath upon exertion:    Short of breath when lying flat:    Irregular heart rhythm:        Vascular    Pain in calf, thigh, or hip brought  on by ambulation:    Pain in feet at night that wakes you up from your sleep:     Blood clot in your veins:    Leg swelling:         Pulmonary    Oxygen at home:    Productive cough:     Wheezing:         Neurologic    Sudden weakness in arms or legs:     Sudden numbness in arms or legs:     Sudden onset of difficulty speaking or slurred speech:    Temporary loss of vision in one eye:     Problems with dizziness:         Gastrointestinal    Blood in stool:     Vomited blood:         Genitourinary    Burning when urinating:     Blood in urine:        Psychiatric    Major depression:         Hematologic    Bleeding problems:    Problems with blood clotting too easily:        Skin    Rashes or ulcers:        Constitutional    Fever or chills:      PHYSICAL  EXAMINATION:  Vitals:   12/27/22 0922  BP: 125/66  Pulse: 79  Temp: 98 F (36.7 C)  SpO2: 93%  Weight: 158 lb 14.4 oz (72.1 kg)  Height: 5\' 6"  (1.676 m)    General:  WDWN in NAD; vital signs documented above Gait: Not observed HENT: WNL, normocephalic Pulmonary: normal non-labored breathing , without Rales, rhonchi,  wheezing Cardiac: regular HR Abdomen: soft, NT, no masses Skin: without rashes Vascular Exam/Pulses: Symmetrical radial pulses Extremities: without ischemic changes, without Gangrene , without cellulitis; without open wounds;  Musculoskeletal: no muscle wasting or atrophy  Neurologic: A&O X 3; CN grossly intact Psychiatric:  The pt has Normal affect.   Non-Invasive Vascular Imaging:   Bidirectional flow in the right vertebral artery  Bilateral ICA 1 to 39% stenosis    ASSESSMENT/PLAN:: 72 y.o. male here for follow up for surveillance of right subclavian artery stenosis as well as bilateral carotid artery stenosis  Subjectively the patient has not experienced any drop attacks or right arm claudication since last office visit.  He is also not had any neurological events to suggest a mini stroke or stroke.  Duplex is stable with bidirectional right vertebral artery flow and 1 to 39% stenosis of bilateral internal carotid arteries.  No indication for intervention of right subclavian artery or carotid arteries at this time.  Continue statin daily.  Follow regularly with your PCP and cardiologist as directed.  We will repeat carotid duplex in 1 year.   Emilie Rutter, PA-C [facility] [phone]  Clinic MD:   Randie Heinz

## 2023-01-08 ENCOUNTER — Ambulatory Visit
Admission: RE | Admit: 2023-01-08 | Discharge: 2023-01-08 | Disposition: A | Discharge status: Home / Self Care | Payer: Medicare Other | Source: Ambulatory Visit | Attending: Family Medicine | Admitting: Family Medicine

## 2023-01-08 DIAGNOSIS — Z122 Encounter for screening for malignant neoplasm of respiratory organs: Secondary | ICD-10-CM

## 2023-01-08 DIAGNOSIS — Z87891 Personal history of nicotine dependence: Secondary | ICD-10-CM | POA: Diagnosis not present

## 2023-01-10 DIAGNOSIS — S46811A Strain of other muscles, fascia and tendons at shoulder and upper arm level, right arm, initial encounter: Secondary | ICD-10-CM | POA: Diagnosis not present

## 2023-01-10 DIAGNOSIS — Z6825 Body mass index (BMI) 25.0-25.9, adult: Secondary | ICD-10-CM | POA: Diagnosis not present

## 2023-01-11 ENCOUNTER — Other Ambulatory Visit: Payer: Self-pay

## 2023-01-11 DIAGNOSIS — I6523 Occlusion and stenosis of bilateral carotid arteries: Secondary | ICD-10-CM

## 2023-02-01 DIAGNOSIS — E039 Hypothyroidism, unspecified: Secondary | ICD-10-CM | POA: Diagnosis not present

## 2023-02-07 ENCOUNTER — Other Ambulatory Visit: Payer: Self-pay | Admitting: Acute Care

## 2023-02-07 ENCOUNTER — Telehealth: Payer: Self-pay | Admitting: Acute Care

## 2023-02-07 DIAGNOSIS — I729 Aneurysm of unspecified site: Secondary | ICD-10-CM

## 2023-02-07 NOTE — Telephone Encounter (Signed)
I have called the patient with the results of his low-dose screening CT.  I explained that his scan was read as a lung RADS 4A as there has been development of a new solid subpleural posterior medial right upper lobe pulmonary nodule that measures 7.1 mm.  Plan will be for 66-month follow-up that will be due after April 10, 2023. There was also notation of a 4.5 cm ascending thoracic aortic aneurysm.  Recommendation is for semiannual imaging by CTA or MRA and referral to thoracic surgery if not already obtained. Patient is in agreement with a 62-month follow-up low-dose screening CT to reevaluate the 7.1 mm nodule.  Additionally he is in agreement with referral to thoracic surgery to continue surveillance of the 4.5 cm ascending thoracic aortic aneurysm.  Patient does have a history of hypertension.  Gillis Santa and [place] please fax results to PCP, let them know plan is for 38-month follow-up low-dose CT screening scan and for referral to thoracic surgery to monitor the ascending thoracic aortic aneurysm.  Thanks so much

## 2023-02-08 ENCOUNTER — Other Ambulatory Visit: Payer: Self-pay

## 2023-02-08 DIAGNOSIS — Z122 Encounter for screening for malignant neoplasm of respiratory organs: Secondary | ICD-10-CM

## 2023-02-08 DIAGNOSIS — Z87891 Personal history of nicotine dependence: Secondary | ICD-10-CM

## 2023-02-08 NOTE — Telephone Encounter (Signed)
Results and plan faxed to PCP. 3 Month f/u scan order placed.

## 2023-03-13 DIAGNOSIS — M62830 Muscle spasm of back: Secondary | ICD-10-CM | POA: Diagnosis not present

## 2023-03-20 ENCOUNTER — Institutional Professional Consult (permissible substitution) (INDEPENDENT_AMBULATORY_CARE_PROVIDER_SITE_OTHER): Payer: Medicare Other | Admitting: Physician Assistant

## 2023-03-20 ENCOUNTER — Encounter: Payer: Self-pay | Admitting: Physician Assistant

## 2023-03-20 VITALS — BP 161/68 | HR 83 | Resp 18 | Ht 66.0 in | Wt 162.0 lb

## 2023-03-20 DIAGNOSIS — I7121 Aneurysm of the ascending aorta, without rupture: Secondary | ICD-10-CM | POA: Diagnosis not present

## 2023-03-20 NOTE — Patient Instructions (Addendum)
Risk Modification Recommendations for patients with ascending thoracic aortic aneurysm:  Continue good control of blood pressure (Goal BP 130/80 or less)  2. Avoid fluoroquinolone antibiotics (I.e Ciprofloxacin, Avelox, Levofloxacin, Ofloxacin) as these have been shown to weaken connective tissue and increase the risk for aneurysmal expansion or rupture  3.  Use of statin like the Crestor you are already taking  4.  Exercise and activity limitations are individualized, but in general, contact sports are to be avoided and one should avoid heavy lifting (defined as half of ideal body weight) and also avoid exercises involving sustained Valsalva maneuver.  5.  Recommend follow-up 6 months after your last CT scan in November 2024.  Also recommend having an echocardiogram at that time.  Will schedule the studies and office follow-up in May, 2025.

## 2023-03-20 NOTE — Progress Notes (Signed)
[address]       [place] [address]             [phone]      PCP is Tally Joe, MD Referring Provider is Bevelyn Ngo, NP  Reason for Consult: Evaluation and surveillance of thoracic aortic aneurysm   HPI: [name]. Gomez is a 73 year old gentleman with a past history notable for prediabetes, hypothyroidism, hypertension, dyslipidemia, and 112-pack-year smoking history having quit in 2022.  He has been getting lung cancer surveillance chest CT scans on regular basis for the past couple years by way of the [facility] .  The CT scan in 2023 noted a 3.9 cm ascending aortic aneurysm.  On repeat study in November 2024, the ascending aorta measured 4.5 cm.  In addition, he was noted to have a 7.1 mm solid right upper lobe nodule and has follow-up CT planned in the near future.  Alfred Gomez also has a history of right subclavian artery and carotid artery stenoses and is followed for surveillance on a regular basis by [facility].  Alfred Gomez denies having any chest pain.  He has no family history of aneurysmal disease or sudden death.  He does not recall ever having an echocardiogram.  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  He walks several miles a week for exercise.    Past Medical History:  Diagnosis Date   Hypertension    Pre-diabetes    Thyroid condition     Past Surgical History:  Procedure Laterality Date   APPENDECTOMY  03/24/2018   LAPAROSCOPIC APPENDECTOMY N/A 03/24/2018   Procedure: APPENDECTOMY LAPAROSCOPIC;  Surgeon: Violeta Gelinas, MD;  Location: [facility] OR;  Service: General;  Laterality: N/A;   MOUTH SURGERY     SHOULDER SURGERY      No family history on file.  Social History Social History   Tobacco Use   Smoking status: Former    Current packs/day: 0.00     Types: Cigarettes    Quit date: 05/2020    Years since quitting: 2.8   Smokeless tobacco: Never  Vaping Use   Vaping status: Never Used  Substance Use Topics   Alcohol use: Never   Drug use: Never    Current Outpatient Medications  Medication Sig Dispense Refill   ezetimibe (ZETIA) 10 MG tablet Take 10 mg by mouth daily.     irbesartan-hydrochlorothiazide (AVALIDE) 150-12.5 MG tablet Take 1 tablet by mouth daily.     metFORMIN (GLUCOPHAGE-XR) 500 MG 24 hr tablet Take 500 mg by mouth 2 (two) times daily.     rosuvastatin (CRESTOR) 20 MG tablet Take 20 mg by mouth daily.     SYNTHROID 125 MCG tablet Take 137 mcg by mouth daily.     No current facility-administered medications for this visit.    No Known Allergies  Review of Systems: Review of Systems  Constitutional: Negative.   HENT: Negative.    Respiratory: Negative.    Gastrointestinal:        Had colonoscopy in May 2024  Genitourinary: Negative.   Musculoskeletal:  Positive for myalgias.       Recent muscular strain involving his right trapezius  Skin: Negative.   Neurological: Negative.   Endo/Heme/Allergies:        History  of type 2 diabetes mellitus     There were no vitals taken for this visit. Physical Exam: Vital signs BP 161/68 Heart rate 83 Respirations 18 SpO2 94% on room air  General: Very pleasant 73 year old male in no distress. HEENT: Unremarkable Neck: No mass, no JVD, no carotid bruit Chest: Symmetrical, normal work of breathing, lung sounds are clear Heart: Regular rate and rhythm, very soft systolic murmur Extremities: No deformities, all well-perfused, no peripheral edema Neuro: Normal mental status and bright affect.  No focal deficits    Diagnostic Tests:  CLINICAL DATA:  73 year old male former smoker with 112 pack-year smoking history, quit smoking 2 years prior   EXAM: CT CHEST WITHOUT CONTRAST LOW-DOSE FOR LUNG CANCER SCREENING Performed    (Performed 01/08/2023)    TECHNIQUE: Multidetector CT imaging of the chest was performed following the standard protocol without IV contrast.   RADIATION DOSE REDUCTION: This exam was performed according to the departmental dose-optimization program which includes automated exposure control, adjustment of the mA and/or kV according to patient size and/or use of iterative reconstruction technique.   COMPARISON:  01/05/2022 screening chest CT   FINDINGS: Cardiovascular: Normal heart size. No significant pericardial effusion/thickening. Left anterior descending and right coronary atherosclerosis. Atherosclerotic thoracic aorta with 4.5 cm ascending thoracic aortic aneurysm. Normal caliber pulmonary arteries.   Mediastinum/Nodes: No significant thyroid nodules. Unremarkable esophagus. No pathologically enlarged axillary, mediastinal or hilar lymph nodes, noting limited sensitivity for the detection of hilar adenopathy on this noncontrast study.   Lungs/Pleura: No pneumothorax. No pleural effusion. Moderate paraseptal and centrilobular centrilobular emphysema with diffuse bronchial wall thickening. No acute consolidative airspace disease or lung masses. No significant growth of previously visualized pulmonary nodules. New solid subpleural posteromedial right upper lobe pulmonary nodule measuring 7.1 mm in volume derived mean diameter (series 9/image 73). No additional new significant pulmonary nodules.   Upper abdomen: Simple 3.8 cm lateral upper right renal cyst, for which no follow-up imaging is recommended.   Musculoskeletal: No aggressive appearing focal osseous lesions. Moderate thoracic spondylosis.   IMPRESSION: 1. Lung-RADS 4A, suspicious. Follow up low-dose chest CT without contrast in 3 months (please use the following order, "CT CHEST LCS NODULE FOLLOW-UP W/O CM") is recommended. New solid 7.1 mm right upper lobe pulmonary nodule. 2. Two-vessel coronary atherosclerosis. 3. Ascending  thoracic aortic 4.5 cm aneurysm. Ascending thoracic aortic aneurysm. Recommend semi-annual imaging followup by CTA or MRA and referral to cardiothoracic surgery if not already obtained. This recommendation follows 2010 ACCF/AHA/AATS/ACR/ASA/SCA/SCAI/SIR/STS/SVM Guidelines for the Diagnosis and Management of Patients With Thoracic Aortic Disease. Circulation. 2010; 121: B147-W295. Aortic aneurysm NOS (ICD10-I71.9). 4. Aortic Atherosclerosis (ICD10-I70.0) and Emphysema (ICD10-J43.9).   These results will be called to the ordering clinician or representative by the Radiologist Assistant, and communication documented in the PACS or [employer].     Electronically Signed   By: Delbert Phenix M.D.   On: 01/30/2023 10:57    Impression / Plan: Risk Modification Recommendations for patients with ascending thoracic aortic aneurysm:  Continue good control of blood pressure (prefer SBP 130/80 or less).  Alfred Gomez said he monitors his blood pressure regularly at home and the systolic is rarely above 120.  2. Avoid fluoroquinolone antibiotics (I.e Ciprofloxacin, Avelox, Levofloxacin, Ofloxacin) as these have been shown to weaken connective tissue and increase the risk for aneurysmal expansion or rupture.  3.  Use of statin like the Crestor you are already taking.  4.  Exercise and activity limitations are individualized, but in general, contact sports  are to be avoided and one should avoid heavy lifting (defined as half of ideal body weight) and also avoid exercises involving sustained Valsalva maneuver.  5.  Recommend follow-up 6 months after your last CT scan in November 2024.  Also recommend echocardiogram prior to that visit. Will schedule the studies and an office visit for May, 2025     Leary Roca, PA-C [facility] [phone]

## 2023-03-27 DIAGNOSIS — M542 Cervicalgia: Secondary | ICD-10-CM | POA: Diagnosis not present

## 2023-04-02 DIAGNOSIS — M542 Cervicalgia: Secondary | ICD-10-CM | POA: Diagnosis not present

## 2023-04-06 DIAGNOSIS — M542 Cervicalgia: Secondary | ICD-10-CM | POA: Diagnosis not present

## 2023-04-09 DIAGNOSIS — M542 Cervicalgia: Secondary | ICD-10-CM | POA: Diagnosis not present

## 2023-04-10 ENCOUNTER — Ambulatory Visit
Admission: RE | Admit: 2023-04-10 | Discharge: 2023-04-10 | Disposition: A | Discharge status: Home / Self Care | Payer: Medicare Other | Source: Ambulatory Visit | Attending: Family Medicine | Admitting: Family Medicine

## 2023-04-10 DIAGNOSIS — I700 Atherosclerosis of aorta: Secondary | ICD-10-CM | POA: Diagnosis not present

## 2023-04-10 DIAGNOSIS — Z122 Encounter for screening for malignant neoplasm of respiratory organs: Secondary | ICD-10-CM

## 2023-04-10 DIAGNOSIS — J439 Emphysema, unspecified: Secondary | ICD-10-CM | POA: Diagnosis not present

## 2023-04-10 DIAGNOSIS — I7121 Aneurysm of the ascending aorta, without rupture: Secondary | ICD-10-CM | POA: Diagnosis not present

## 2023-04-10 DIAGNOSIS — R911 Solitary pulmonary nodule: Secondary | ICD-10-CM | POA: Diagnosis not present

## 2023-04-10 DIAGNOSIS — Z87891 Personal history of nicotine dependence: Secondary | ICD-10-CM

## 2023-04-13 DIAGNOSIS — M542 Cervicalgia: Secondary | ICD-10-CM | POA: Diagnosis not present

## 2023-04-17 DIAGNOSIS — M542 Cervicalgia: Secondary | ICD-10-CM | POA: Diagnosis not present

## 2023-04-19 DIAGNOSIS — M542 Cervicalgia: Secondary | ICD-10-CM | POA: Diagnosis not present

## 2023-04-23 DIAGNOSIS — M542 Cervicalgia: Secondary | ICD-10-CM | POA: Diagnosis not present

## 2023-04-27 DIAGNOSIS — M542 Cervicalgia: Secondary | ICD-10-CM | POA: Diagnosis not present

## 2023-04-30 ENCOUNTER — Telehealth: Payer: Self-pay

## 2023-04-30 NOTE — Telephone Encounter (Signed)
 Call Report from [facility]. Awaiting call back from radiology to clarify Rad level.   IMPRESSION: 1. Previously characterized Lung-RADS 4A right upper lobe nodule is stable or minimally smaller. Lung-RADS 3, probably benign findings. Short-term follow-up in 6 months is recommended with repeat low-dose chest CT without contrast (please use the following order, "CT CHEST LCS NODULE FOLLOW-UP W/O CM"). 2. Basilar subpleural reticular densities, coarsened ground-glass and bronchiolectasis, findings indicative of interstitial lung disease such as usual interstitial pneumonitis or fibrotic nonspecific interstitial pneumonitis. 3. 4.1 cm ascending aortic aneurysm, stable. Recommend annual imaging followup by CTA or MRA. This recommendation follows 2010 ACCF/AHA/AATS/ACR/ASA/SCA/SCAI/SIR/STS/SVM Guidelines for the Diagnosis and Management of Patients with Thoracic Aortic Disease. Circulation. 2010; 121: W119-J478. Aortic aneurysm NOS (ICD10-I71.9). 4. Aortic atherosclerosis (ICD10-I70.0). Coronary artery calcification. 5. Enlarged pulmonic trunk, indicative of pulmonary arterial hypertension. 6.  Emphysema (ICD10-J43.9).

## 2023-05-01 ENCOUNTER — Other Ambulatory Visit: Payer: Self-pay

## 2023-05-01 DIAGNOSIS — Z87891 Personal history of nicotine dependence: Secondary | ICD-10-CM

## 2023-05-01 DIAGNOSIS — R911 Solitary pulmonary nodule: Secondary | ICD-10-CM

## 2023-05-01 DIAGNOSIS — Z122 Encounter for screening for malignant neoplasm of respiratory organs: Secondary | ICD-10-CM

## 2023-05-01 NOTE — Telephone Encounter (Signed)
 Called and spoke with patient. Reviewed results below. Radiology confirmed results are now a Rads3. Advised patient 6 month f/u recommended. Pt agrees. 6 Month order placed and CT scheduled for 10/24/2023 at GI per patient request. No additional questions. Results and plan sent to PCP.

## 2023-05-25 DIAGNOSIS — M542 Cervicalgia: Secondary | ICD-10-CM | POA: Diagnosis not present

## 2023-06-15 DIAGNOSIS — M542 Cervicalgia: Secondary | ICD-10-CM | POA: Diagnosis not present

## 2023-06-18 DIAGNOSIS — E7800 Pure hypercholesterolemia, unspecified: Secondary | ICD-10-CM | POA: Diagnosis not present

## 2023-06-18 DIAGNOSIS — I6521 Occlusion and stenosis of right carotid artery: Secondary | ICD-10-CM | POA: Diagnosis not present

## 2023-06-18 DIAGNOSIS — E039 Hypothyroidism, unspecified: Secondary | ICD-10-CM | POA: Diagnosis not present

## 2023-06-18 DIAGNOSIS — I10 Essential (primary) hypertension: Secondary | ICD-10-CM | POA: Diagnosis not present

## 2023-06-18 DIAGNOSIS — M720 Palmar fascial fibromatosis [Dupuytren]: Secondary | ICD-10-CM | POA: Diagnosis not present

## 2023-06-18 DIAGNOSIS — E1169 Type 2 diabetes mellitus with other specified complication: Secondary | ICD-10-CM | POA: Diagnosis not present

## 2023-06-18 DIAGNOSIS — I7121 Aneurysm of the ascending aorta, without rupture: Secondary | ICD-10-CM | POA: Diagnosis not present

## 2023-06-18 DIAGNOSIS — N529 Male erectile dysfunction, unspecified: Secondary | ICD-10-CM | POA: Diagnosis not present

## 2023-07-05 ENCOUNTER — Other Ambulatory Visit: Payer: Self-pay | Admitting: Thoracic Surgery (Cardiothoracic Vascular Surgery)

## 2023-07-05 DIAGNOSIS — I7121 Aneurysm of the ascending aorta, without rupture: Secondary | ICD-10-CM

## 2023-07-17 ENCOUNTER — Ambulatory Visit (HOSPITAL_COMMUNITY)
Admission: RE | Admit: 2023-07-17 | Discharge: 2023-07-17 | Disposition: A | Discharge status: Home / Self Care | Source: Ambulatory Visit | Attending: Cardiology | Admitting: Cardiology

## 2023-07-17 DIAGNOSIS — I7121 Aneurysm of the ascending aorta, without rupture: Secondary | ICD-10-CM | POA: Diagnosis not present

## 2023-07-17 MED ORDER — IOHEXOL 350 MG/ML SOLN
75.0000 mL | Freq: Once | INTRAVENOUS | Status: AC | PRN
  Administered 2023-07-17: 75 mL via INTRAVENOUS

## 2023-07-24 ENCOUNTER — Ambulatory Visit

## 2023-08-01 NOTE — Progress Notes (Unsigned)
 [address]       [place] [address]             [phone]        Alfred Gomez [id number] [birth year]  History of Present Illness: Alfred Gomez is a 73 year old male with a past medical history of hypertension, dyslipidemia, prediabetes, hypothyroidism, 112 pack year smoking history but quit in 2022, right subclavian artery and carotid artery stenosis folled by vascular and vein specialist. He is part of the lung cancer screening program and CT scan in 2023 noted a 3.9cm ascending aorta but CT scan in 2024 showed a 4.5cm ascending aortic aneurysm and a new right upper lobe nodule measuring 7.11mm. Lung nodule on follow up CT scan was stable to minimally decreased likely benign lung nodule.   He denies family history of ATAA and personal history of connective tissue disorder.  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 He denies chest pain, chest tightness, shortness of breath, and lower extremity swelling. He does admit to dizziness when he stands too fast but denies LOC.    Current Outpatient Medications on File Prior to Visit  Medication Sig Dispense Refill   ezetimibe  (ZETIA ) 10 MG tablet Take 10 mg by mouth daily.     irbesartan -hydrochlorothiazide  (AVALIDE) 150-12.5 MG tablet Take 1 tablet by mouth daily.     metFORMIN (GLUCOPHAGE-XR) 500 MG 24 hr tablet Take 500 mg by mouth 2 (two) times daily.     rosuvastatin  (CRESTOR ) 20 MG tablet Take 20 mg by mouth daily.     SYNTHROID  125 MCG tablet Take 137 mcg by mouth daily.     tiZANidine (ZANAFLEX) 4 MG tablet 1/2 -1 tab Orally every 8 -12 hours for 7 days     No current facility-administered medications on file prior to visit.   Vitals: Today's Vitals   08/14/23 1019  BP: 129/64  Pulse: 92  Resp: 20  SpO2: 94%  Weight: 155 lb 9.6 oz (70.6 kg)  Height: 5' 6 (1.676 m)   Body mass index is 25.11 kg/m.   Physical Exam General: Alert and  oriented, no acute distress Neuro: Grossly intact CV: Regular rate and rhythm, no murmur GI: +BS, nontender Extremities: No edema, 2+ radial pulses bilaterally  CTA Results: EXAMINATION: CT ANGIO CHEST AORTA W & OR WO CONTRAST   CLINICAL INDICATION: Male, 73 years old. Aortic aneurysm suspected   TECHNIQUE: Axial CTA of the abdomen with 100 cc Omnipaque  300 intravenous contrast. Multiplanar and 3D reformations provided. Unless otherwise specified, incidental thyroid , adrenal, renal lesions do not require dedicated imaging follow up. Additionally, any mentioned pulmonary nodules do not require dedicated imaging follow-up based on the Fleischner guidelines unless otherwise specified. Coronary calcifications are not identified unless otherwise specified.   COMPARISON: 04/10/2023   FINDINGS:   The ascending thoracic aorta remains dilated measuring up to 4.6 cm at the level of the sinus of Valsalva, unchanged when measured in similar fashion on prior study.   The aortic arch and descending thoracic aorta are normal in caliber. Scattered atherosclerotic changes are present. There is usual 3 branch aortic arch. The origins of the great vessels appear patent.   The main pulmonary artery is at the upper limit of normal for size. The heart is normal in size. There are coronary calcifications. There are mitral annular calcifications. There is no free fluid or adenopathy. The visualized  upper abdominal structures are unremarkable for bilateral renal cysts and colonic diverticulosis. The trachea and mainstem bronchi are patent. There is moderate centrilobular/paraseptal pulmonary emphysema. Continued interval decrease in size of the right upper lobe pulmonary nodule now measuring 3 mm (series 304 image 160).   There are mild degenerative changes of the spine.   IMPRESSION:   Similar dilatation of the ascending thoracic aorta measuring up to 4.6 cm.   DOSE REDUCTION: This exam was  performed according to our departmental dose-optimization program which includes automated exposure control, adjustment of the mA and/or kV according to patient size and/or use of iterative reconstruction technique.   Electronically signed by: Italy Engel MD 07/17/2023 06:52 PM EDT RP Workstation: [id number]   Impression and Plan: ATAA: Alfred Gomez presents to the clinic with a stable 4.6 cm ascending aortic aneurysm.  Echocardiogram shows a tricuspid aortic valve with trivial aortic regurgitation.  We discussed the natural history and and risk factors for growth of ascending aortic aneurysms.  We covered the importance of continued smoking cessation, tight blood pressure control, refraining from lifting heavy objects, and avoiding fluoroquinolones.  The patient is aware of signs and symptoms of aortic dissection and when to present to the emergency department.  The patient does not currently meet surgical threshold of 5.5cm. We will continue surveillance, plan to have the patient return to clinic in 6 months with chest CTA.   Lung nodule: Decrease in size of the RUL pulmonary nodule now measuring 3mm previously measuring 7.55mm. Will continue to monitor on surveillance CT scans for aneurysm.   Lightheadedness: Mainly when standing too quickly, we reviewed slower change in posture and increased fluid intake especially when working. Patient should follow up with his PCP.   Coronary and mitral valve calcification: Continue zetia  and crestor . Continue follow up with PCP.   Risk Modification:  Statin:  Zetia  and Crestor   Smoking cessation instruction/counseling given:  Quit smoking 3 years ago  Patient was counseled on importance of Blood Pressure Control.  Despite Medical intervention if the patient notices persistently elevated blood pressure readings.  They are instructed to contact their Primary Care Physician  Please avoid use of Fluoroquinolones as this can potentially increase your risk of  Aortic Rupture and/or Dissection  Patient educated on signs and symptoms of Aortic Dissection, handout also provided in AVS  Con [name], PA-C 08/01/23

## 2023-08-01 NOTE — Patient Instructions (Signed)

## 2023-08-13 ENCOUNTER — Ambulatory Visit (HOSPITAL_COMMUNITY)
Admission: RE | Admit: 2023-08-13 | Discharge: 2023-08-13 | Disposition: A | Discharge status: Home / Self Care | Source: Ambulatory Visit | Attending: Cardiology | Admitting: Cardiology

## 2023-08-13 DIAGNOSIS — I359 Nonrheumatic aortic valve disorder, unspecified: Secondary | ICD-10-CM | POA: Diagnosis not present

## 2023-08-13 DIAGNOSIS — I7121 Aneurysm of the ascending aorta, without rupture: Secondary | ICD-10-CM | POA: Diagnosis not present

## 2023-08-13 LAB — ECHOCARDIOGRAM COMPLETE: S' Lateral: 3.07 cm

## 2023-08-14 ENCOUNTER — Ambulatory Visit: Attending: Thoracic Surgery (Cardiothoracic Vascular Surgery) | Admitting: Physician Assistant

## 2023-08-14 VITALS — BP 129/64 | HR 92 | Resp 20 | Ht 66.0 in | Wt 155.6 lb

## 2023-08-14 DIAGNOSIS — I7121 Aneurysm of the ascending aorta, without rupture: Secondary | ICD-10-CM | POA: Insufficient documentation

## 2023-08-14 DIAGNOSIS — I712 Thoracic aortic aneurysm, without rupture, unspecified: Secondary | ICD-10-CM | POA: Insufficient documentation

## 2023-10-23 DIAGNOSIS — H25813 Combined forms of age-related cataract, bilateral: Secondary | ICD-10-CM | POA: Diagnosis not present

## 2023-10-23 DIAGNOSIS — E119 Type 2 diabetes mellitus without complications: Secondary | ICD-10-CM | POA: Diagnosis not present

## 2023-10-23 DIAGNOSIS — H02834 Dermatochalasis of left upper eyelid: Secondary | ICD-10-CM | POA: Diagnosis not present

## 2023-10-23 DIAGNOSIS — H43813 Vitreous degeneration, bilateral: Secondary | ICD-10-CM | POA: Diagnosis not present

## 2023-10-23 DIAGNOSIS — H02831 Dermatochalasis of right upper eyelid: Secondary | ICD-10-CM | POA: Diagnosis not present

## 2023-10-24 ENCOUNTER — Ambulatory Visit
Admission: RE | Admit: 2023-10-24 | Discharge: 2023-10-24 | Disposition: A | Discharge status: Home / Self Care | Source: Ambulatory Visit | Attending: Acute Care | Admitting: Acute Care

## 2023-10-24 DIAGNOSIS — Z122 Encounter for screening for malignant neoplasm of respiratory organs: Secondary | ICD-10-CM

## 2023-10-24 DIAGNOSIS — Z87891 Personal history of nicotine dependence: Secondary | ICD-10-CM | POA: Diagnosis not present

## 2023-10-24 DIAGNOSIS — I7121 Aneurysm of the ascending aorta, without rupture: Secondary | ICD-10-CM | POA: Diagnosis not present

## 2023-10-24 DIAGNOSIS — R911 Solitary pulmonary nodule: Secondary | ICD-10-CM

## 2023-11-08 ENCOUNTER — Telehealth: Payer: Self-pay | Admitting: Acute Care

## 2023-11-08 ENCOUNTER — Other Ambulatory Visit: Payer: Self-pay

## 2023-11-08 DIAGNOSIS — Z87891 Personal history of nicotine dependence: Secondary | ICD-10-CM

## 2023-11-08 DIAGNOSIS — Z122 Encounter for screening for malignant neoplasm of respiratory organs: Secondary | ICD-10-CM

## 2023-11-08 NOTE — Telephone Encounter (Signed)
 A one year is fine on this. This nodule is growing at a slow rate. But lets put it on the tickle list  to make sure we look at its size regardless of LR score . Once it gets big enough to PET we will PET it.  Please fax results to PCP and let them know plan of care.  Next scan will be due 10/23/2024 ish. Aortic atherosclerosis, Coronary artery calcifications, on statin. Thanks so much

## 2023-11-08 NOTE — Telephone Encounter (Signed)
 Noted, one year annual order placed. Patient added to reminder list to follow for needed PET. Results and plan to PCP.

## 2023-12-31 ENCOUNTER — Other Ambulatory Visit: Payer: Self-pay | Admitting: Thoracic Surgery (Cardiothoracic Vascular Surgery)

## 2023-12-31 DIAGNOSIS — I7121 Aneurysm of the ascending aorta, without rupture: Secondary | ICD-10-CM

## 2024-01-16 ENCOUNTER — Other Ambulatory Visit: Payer: Self-pay

## 2024-01-16 DIAGNOSIS — I6523 Occlusion and stenosis of bilateral carotid arteries: Secondary | ICD-10-CM

## 2024-01-29 ENCOUNTER — Ambulatory Visit (HOSPITAL_COMMUNITY)
Admission: RE | Admit: 2024-01-29 | Discharge: 2024-01-29 | Disposition: A | Source: Ambulatory Visit | Attending: Thoracic Surgery (Cardiothoracic Vascular Surgery) | Admitting: Thoracic Surgery (Cardiothoracic Vascular Surgery)

## 2024-01-29 DIAGNOSIS — I7121 Aneurysm of the ascending aorta, without rupture: Secondary | ICD-10-CM

## 2024-01-29 DIAGNOSIS — I2510 Atherosclerotic heart disease of native coronary artery without angina pectoris: Secondary | ICD-10-CM | POA: Diagnosis not present

## 2024-01-29 DIAGNOSIS — R918 Other nonspecific abnormal finding of lung field: Secondary | ICD-10-CM | POA: Diagnosis not present

## 2024-01-29 DIAGNOSIS — N281 Cyst of kidney, acquired: Secondary | ICD-10-CM | POA: Diagnosis not present

## 2024-01-29 DIAGNOSIS — J432 Centrilobular emphysema: Secondary | ICD-10-CM | POA: Diagnosis not present

## 2024-01-29 MED ORDER — IOHEXOL 350 MG/ML SOLN
75.0000 mL | Freq: Once | INTRAVENOUS | Status: AC | PRN
  Administered 2024-01-29: 75 mL via INTRAVENOUS

## 2024-02-01 DIAGNOSIS — E1169 Type 2 diabetes mellitus with other specified complication: Secondary | ICD-10-CM | POA: Diagnosis not present

## 2024-02-01 DIAGNOSIS — I6521 Occlusion and stenosis of right carotid artery: Secondary | ICD-10-CM | POA: Diagnosis not present

## 2024-02-01 DIAGNOSIS — N529 Male erectile dysfunction, unspecified: Secondary | ICD-10-CM | POA: Diagnosis not present

## 2024-02-01 DIAGNOSIS — I10 Essential (primary) hypertension: Secondary | ICD-10-CM | POA: Diagnosis not present

## 2024-02-01 DIAGNOSIS — Z0000 Encounter for general adult medical examination without abnormal findings: Secondary | ICD-10-CM | POA: Diagnosis not present

## 2024-02-01 DIAGNOSIS — Z1331 Encounter for screening for depression: Secondary | ICD-10-CM | POA: Diagnosis not present

## 2024-02-01 DIAGNOSIS — Z125 Encounter for screening for malignant neoplasm of prostate: Secondary | ICD-10-CM | POA: Diagnosis not present

## 2024-02-01 DIAGNOSIS — E039 Hypothyroidism, unspecified: Secondary | ICD-10-CM | POA: Diagnosis not present

## 2024-02-01 DIAGNOSIS — I7121 Aneurysm of the ascending aorta, without rupture: Secondary | ICD-10-CM | POA: Diagnosis not present

## 2024-02-01 DIAGNOSIS — E7800 Pure hypercholesterolemia, unspecified: Secondary | ICD-10-CM | POA: Diagnosis not present

## 2024-02-01 DIAGNOSIS — Z1211 Encounter for screening for malignant neoplasm of colon: Secondary | ICD-10-CM | POA: Diagnosis not present

## 2024-02-12 ENCOUNTER — Ambulatory Visit

## 2024-02-12 VITALS — BP 128/62 | HR 92 | Resp 20 | Ht 66.0 in | Wt 161.0 lb

## 2024-02-12 DIAGNOSIS — I7121 Aneurysm of the ascending aorta, without rupture: Secondary | ICD-10-CM | POA: Insufficient documentation

## 2024-02-12 NOTE — Patient Instructions (Addendum)

## 2024-02-12 NOTE — Progress Notes (Signed)
 "      [address] [place] [address]             [phone]            Alfred Gomez [id number] 02-Jul-1950   History of Present Illness:  Alfred Gomez is a 73 year old man with medical history of hypertension, hypothyroidism, bilateral carotid artery stenosis, former tobacco user, hyperlipidemia and type 2 diabetes who presents for continued follow up of ascending thoracic aortic aneurysm.  He was last seen 6 months ago by our clinic.  On recent CTA of chest aneurysm measured 4.3 cm and aortic root dilatation measured 4.9 cm.  Previous echocardiogram in 07/2023 showed that the aortic valve is tricuspid with trivial aortic valve regurgitation.   He presents to the clinic today and reports that he is doing well. His blood pressure is well controlled with current medications. His home BP readings are 120s-130s/70s. He is very active with walking.  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 He denies heavy lifting.  He denies chest pain, shortness of breath and lower leg edema.    Medications Ordered Prior to Encounter[1]   ROS: Review of Systems  Constitutional: Negative.  Negative for fever and weight loss.  Respiratory: Negative.  Negative for cough and shortness of breath.   Cardiovascular: Negative.  Negative for chest pain, palpitations and leg swelling.     BP 128/62   Pulse 92   Resp 20   Ht 5' 6 (1.676 m)   Wt 161 lb (73 kg)   SpO2 96% Comment: RA  BMI 25.99 kg/m   Physical Exam Constitutional:      Appearance: Normal appearance.  HENT:     Head: Normocephalic and atraumatic.  Cardiovascular:     Rate and Rhythm: Normal rate and regular rhythm.     Heart sounds: Normal heart sounds, S1 normal and S2 normal.  Pulmonary:     Effort: Pulmonary effort is normal.     Breath sounds: Normal breath sounds.  Skin:    General: Skin is warm and dry.  Neurological:     General: No focal deficit present.     Mental Status: He  is alert and oriented to person, place, and time.      Imaging: EXAM: CTA CHEST AORTA 01/29/2024 09:02:05 AM   TECHNIQUE: CTA of the chest was performed after the administration of 75 mL of iohexol  (OMNIPAQUE ) 350 MG/ML injection. Multiplanar reformatted images are provided for review. MIP images are provided for review. Automated exposure control, iterative reconstruction, and/or weight based adjustment of the mA/kV was utilized to reduce the radiation dose to as low as reasonably achievable.   COMPARISON: CT of the chest dated 10/24/2023.   CLINICAL HISTORY: Aortic aneurysm suspected.   FINDINGS:   AORTA: Stable fusiform aneurysmal dilatation of the ascending thoracic aorta, which again measures approximately 4.3 x 4.0 cm at the level of the pulmonary arteries. It measures approximately 4.9 cm in diameter at the sinuses of Valsalva. The aortic arch measures approximately 3.0 cm in diameter. The descending thoracic aorta is normal in caliber, measuring approximately 2.7 cm in diameter proximally. There is standard 3-vessel takeoff of the great arteries. There is mild calcific atheromatous disease within the brachiocephalic artery and left subclavian artery, but there is no appreciable stenosis. No thoracic aortic dissection. No intramural hematoma, aortic ulceration, or rupture.   MEDIASTINUM: No mediastinal lymphadenopathy. The heart  and pericardium demonstrate no acute abnormality. Moderate calcific coronary artery disease present.   LYMPH NODES: No mediastinal, hilar or axillary lymphadenopathy.   LUNGS AND PLEURA: Moderate-to-severe central lobular emphysema. The pulmonary arteries are widely patent. No focal consolidation or pulmonary edema. No pleural effusion or pneumothorax.   UPPER ABDOMEN: Limited images of the upper abdomen demonstrate simple-appearing cysts present within the kidneys bilaterally.   SOFT TISSUES AND BONES: No acute bone or soft tissue  abnormality.   IMPRESSION: 1. Stable fusiform aneurysmal dilatation of the ascending thoracic aorta, measuring approximately 4.3 x 4.0 cm at the level of the pulmonary arteries and 4.9 cm at the sinuses of Valsalva. 2. Mild calcific atheromatous disease within the brachiocephalic artery and left subclavian artery without appreciable stenosis. 3. Moderate calcific coronary artery disease. 4. Moderate-to-severe centrilobular emphysema; given emphysema is an independent lung cancer risk factor and patient age is assumed between 5080 if not specified, consider evaluation for a low-dose CT lung cancer screening program.   Electronically signed by: Evalene Coho MD 01/29/2024 09:22 AM EST RP Workstation: [id number]   A/P: Aneurysm of ascending aorta without rupture -4.3 cm ascending thoracic aortic aneurysm and 4.9 cm aortic root dilatation on CTA of chest. Echocardiogram showed tricuspid aortic valve. -We discussed the natural history and and risk factors for growth of ascending aortic aneurysms. Discussed recommendations to minimize the risk of further expansion or dissection including careful blood pressure control, avoidance of contact sports and heavy lifting, attention to lipid management.  We covered the importance of continued smoking cessation.  The patient does not yet meet surgical criteria of >5.5cm. The patient is aware of signs and symptoms of aortic dissection and when to present to the emergency department   -Follow up in 6 months with CTA of chest for continued surveillance    Pulmonary Nodules -Followed by pulmonology and had low dose CT scan on 10/2023 -Continue follow up as scheduled    Risk Modification:  Statin:  rosuvastatin   Smoking cessation instruction/counseling given:  commended patient for quitting and reviewed strategies for preventing relapses  Patient was counseled on importance of Blood Pressure Control  They are instructed to contact their Primary  Care Physician if they start to have blood pressure readings over 130s/90s. Do not ever stop blood pressure medications on your own, unless instructed by healthcare professional.  Please avoid use of Fluoroquinolones as this can potentially increase your risk of Aortic Rupture and/or Dissection  Patient educated on signs and symptoms of Aortic Dissection, handout also provided in AVS  Manuelita CHRISTELLA Rough, PA-C 02/12/2024     [1]  Current Outpatient Medications on File Prior to Visit  Medication Sig Dispense Refill   ezetimibe  (ZETIA ) 10 MG tablet Take 10 mg by mouth daily.     irbesartan -hydrochlorothiazide  (AVALIDE) 150-12.5 MG tablet Take 1 tablet by mouth daily.     metFORMIN (GLUCOPHAGE-XR) 500 MG 24 hr tablet Take 500 mg by mouth 2 (two) times daily.     rosuvastatin  (CRESTOR ) 20 MG tablet Take 20 mg by mouth daily.     SYNTHROID  125 MCG tablet Take 137 mcg by mouth daily.     tiZANidine (ZANAFLEX) 4 MG tablet 1/2 -1 tab Orally every 8 -12 hours for 7 days (Patient not taking: Reported on 02/12/2024)     No current facility-administered medications on file prior to visit.   "

## 2024-02-27 ENCOUNTER — Ambulatory Visit (HOSPITAL_COMMUNITY)
Admission: RE | Admit: 2024-02-27 | Discharge: 2024-02-27 | Disposition: A | Discharge status: Home / Self Care | Source: Ambulatory Visit | Attending: Surgery | Admitting: Surgery

## 2024-02-27 ENCOUNTER — Ambulatory Visit: Admitting: Physician Assistant

## 2024-02-27 VITALS — BP 96/53 | HR 88 | Temp 98.0°F | Wt 159.4 lb

## 2024-02-27 DIAGNOSIS — I6523 Occlusion and stenosis of bilateral carotid arteries: Secondary | ICD-10-CM | POA: Diagnosis not present

## 2024-02-27 DIAGNOSIS — I771 Stricture of artery: Secondary | ICD-10-CM | POA: Insufficient documentation

## 2024-02-27 NOTE — Progress Notes (Signed)
 " Office Note     CC:  follow up Requesting Provider:  Seabron Lenis, MD  HPI: Alfred Gomez is a 74 y.o. (Oct 15, 1950) male who presents for surveillance of carotid and subclavian artery stenosis.  He denies any strokelike symptoms including slurring speech, changes in vision, or one-sided weakness.  He also denies any significant right arm claudication or drop attacks.  Duplex last year demonstrated less than 39% stenosis of bilateral internal carotid arteries.  He knows to check blood pressure on his left arm for the most accurate reading.  He is on a statin daily.  He is a former smoker.  He still works as a set designer at [employer].   Past Medical History:  Diagnosis Date   Hypertension    Pre-diabetes    Thyroid  condition     Past Surgical History:  Procedure Laterality Date   APPENDECTOMY  03/24/2018   LAPAROSCOPIC APPENDECTOMY N/A 03/24/2018   Procedure: APPENDECTOMY LAPAROSCOPIC;  Surgeon: Alfred Moles, MD;  Location: [facility] OR;  Service: General;  Laterality: N/A;   MOUTH SURGERY     SHOULDER SURGERY      Social History   Socioeconomic History   Marital status: Married    Spouse name: Not on file   Number of children: Not on file   Years of education: Not on file   Highest education level: Not on file  Occupational History   Not on file  Tobacco Use   Smoking status: Former    Current packs/day: 0.00    Average packs/day: 1.5 packs/day    Types: Cigarettes    Quit date: 05/2020    Years since quitting: 3.7   Smokeless tobacco: Never  Vaping Use   Vaping status: Never Used  Substance and Sexual Activity   Alcohol use: Never   Drug use: Never   Sexual activity: Not on file  Other Topics Concern   Not on file  Social History Narrative   Not on file   Social Drivers of Health   Tobacco Use: Medium Risk (02/27/2024)   Patient History    Smoking Tobacco Use: Former    Smokeless Tobacco Use: Never    Passive Exposure: Not on [employer]or, Minerals Strain: Not on file  Food Insecurity: Not on file  Transportation Needs: Not on file  Physical Activity: Not on file  Stress: Not on file  Social Connections: Not on file  Intimate Partner Violence: Not on file  Depression (EYV7-0): Not on file  Alcohol Screen: Not on file  Housing: Not on file  Utilities: Not on file  Health Literacy: Not on file   History reviewed. No pertinent family history.  Current Outpatient Medications  Medication Sig Dispense Refill   ezetimibe  (ZETIA ) 10 MG tablet Take 10 mg by mouth daily.     irbesartan -hydrochlorothiazide  (AVALIDE) 150-12.5 MG tablet Take 1 tablet by mouth daily.     metFORMIN (GLUCOPHAGE-XR) 500 MG 24 hr tablet Take 500 mg by mouth 2 (two) times daily.     rosuvastatin  (CRESTOR ) 20 MG tablet Take 20 mg by mouth daily.     SYNTHROID  125 MCG tablet Take 137 mcg by mouth daily.     tiZANidine (ZANAFLEX) 4 MG tablet 1/2 -1 tab Orally every 8 -12 hours for 7 days (Patient not taking: Reported on 02/12/2024)     No current facility-administered medications for this visit.    Allergies[1]   REVIEW OF SYSTEMS:  Negative unless noted in HPI [X]   denotes positive finding, [ ]  denotes negative finding Cardiac  Comments:  Chest pain or chest pressure:    Shortness of breath upon exertion:    Short of breath when lying flat:    Irregular heart rhythm:        Vascular    Pain in calf, thigh, or hip brought on by ambulation:    Pain in feet at night that wakes you up from your sleep:     Blood clot in your veins:    Leg swelling:         Pulmonary    Oxygen at home:    Productive cough:     Wheezing:         Neurologic    Sudden weakness in arms or legs:     Sudden numbness in arms or legs:     Sudden onset of difficulty speaking or slurred speech:    Temporary loss of vision in one eye:     Problems with dizziness:         Gastrointestinal    Blood in stool:     Vomited blood:         Genitourinary    Burning when  urinating:     Blood in urine:        Psychiatric    Major depression:         Hematologic    Bleeding problems:    Problems with blood clotting too easily:        Skin    Rashes or ulcers:        Constitutional    Fever or chills:      PHYSICAL EXAMINATION:  Vitals:   02/27/24 1404 02/27/24 1406  BP: 115/66 (!) 96/53  Pulse: 84 88  Temp: 98 F (36.7 C)   Weight: 159 lb 6.4 oz (72.3 kg)     General:  WDWN in NAD; vital signs documented above Gait: Not observed HENT: WNL, normocephalic Pulmonary: normal non-labored breathing Cardiac: regular HR Abdomen: soft, NT, no masses Skin: without rashes Vascular Exam/Pulses: palpable radial pulses L better than R Extremities: without ischemic changes, without Gangrene , without cellulitis; without open wounds;  Musculoskeletal: no muscle wasting or atrophy  Neurologic: A&O X 3; CN grossly intact Psychiatric:  The pt has Normal affect.   Non-Invasive Vascular Imaging:   1-39% stenosis B ICA   Bi-directional R vertebral    ASSESSMENT/PLAN:: 74 y.o. male here for follow up for surveillance of carotid and right subclavian artery stenosis  [name] is subjectively doing well since last office visit and has not had any neurological events.  Carotid duplex demonstrates 1 to 39% stenosis of bilateral internal carotid arteries.  He has bidirectional flow in the right vertebral artery with a blood pressure discrepancy between arms.  Subclavian stenosis continues to be asymptomatic.  No indication for intervention at this time.  We will repeat carotid duplex in 2 years.  He will notify the office with any questions or concerns.   Alfred Sender, PA-C [facility] [phone]  Clinic MD:   Alfred Gomez     [1]  Allergies Allergen Reactions   Quinolones Other (See Comments)    Patient with ATAA (ascending thoracic aortic aneurysm) so should avoid this class of antibiotic to avoid increase risk of dissection    "
